# Patient Record
Sex: Female | Born: 1980 | Race: White | Hispanic: No | Smoking: Never smoker
Health system: Southern US, Community
[De-identification: ages and names within clinical notes are randomized; demographics above are authoritative.]

## PROBLEM LIST (undated history)

## (undated) DIAGNOSIS — F419 Anxiety disorder, unspecified: Secondary | ICD-10-CM

## (undated) DIAGNOSIS — O24419 Gestational diabetes mellitus in pregnancy, unspecified control: Secondary | ICD-10-CM

## (undated) DIAGNOSIS — F32A Depression, unspecified: Secondary | ICD-10-CM

## (undated) DIAGNOSIS — F319 Bipolar disorder, unspecified: Secondary | ICD-10-CM

## (undated) DIAGNOSIS — D649 Anemia, unspecified: Secondary | ICD-10-CM

## (undated) DIAGNOSIS — F329 Major depressive disorder, single episode, unspecified: Secondary | ICD-10-CM

## (undated) HISTORY — PX: WISDOM TOOTH EXTRACTION: SHX21

---

## 2011-11-04 ENCOUNTER — Encounter (HOSPITAL_COMMUNITY): Payer: Self-pay | Admitting: Emergency Medicine

## 2011-11-04 ENCOUNTER — Inpatient Hospital Stay (HOSPITAL_COMMUNITY)
Admission: AD | Admit: 2011-11-04 | Discharge: 2011-11-06 | DRG: 449 | Disposition: A | Discharge status: Other Acute Inpatient Hospital | Payer: BC Managed Care – PPO | Attending: Internal Medicine | Admitting: Internal Medicine

## 2011-11-04 DIAGNOSIS — D649 Anemia, unspecified: Secondary | ICD-10-CM | POA: Diagnosis present

## 2011-11-04 DIAGNOSIS — I498 Other specified cardiac arrhythmias: Secondary | ICD-10-CM | POA: Diagnosis present

## 2011-11-04 DIAGNOSIS — F3289 Other specified depressive episodes: Secondary | ICD-10-CM | POA: Diagnosis present

## 2011-11-04 DIAGNOSIS — R45851 Suicidal ideations: Secondary | ICD-10-CM

## 2011-11-04 DIAGNOSIS — T424X4A Poisoning by benzodiazepines, undetermined, initial encounter: Principal | ICD-10-CM | POA: Diagnosis present

## 2011-11-04 DIAGNOSIS — T424X1A Poisoning by benzodiazepines, accidental (unintentional), initial encounter: Secondary | ICD-10-CM | POA: Diagnosis present

## 2011-11-04 DIAGNOSIS — T43204A Poisoning by unspecified antidepressants, undetermined, initial encounter: Secondary | ICD-10-CM | POA: Diagnosis present

## 2011-11-04 DIAGNOSIS — T43502A Poisoning by unspecified antipsychotics and neuroleptics, intentional self-harm, initial encounter: Secondary | ICD-10-CM | POA: Diagnosis present

## 2011-11-04 DIAGNOSIS — T50901A Poisoning by unspecified drugs, medicaments and biological substances, accidental (unintentional), initial encounter: Secondary | ICD-10-CM

## 2011-11-04 DIAGNOSIS — D6489 Other specified anemias: Secondary | ICD-10-CM | POA: Diagnosis present

## 2011-11-04 DIAGNOSIS — F329 Major depressive disorder, single episode, unspecified: Secondary | ICD-10-CM | POA: Diagnosis present

## 2011-11-04 DIAGNOSIS — E876 Hypokalemia: Secondary | ICD-10-CM | POA: Diagnosis present

## 2011-11-04 DIAGNOSIS — T43201A Poisoning by unspecified antidepressants, accidental (unintentional), initial encounter: Secondary | ICD-10-CM | POA: Diagnosis present

## 2011-11-04 DIAGNOSIS — T438X2A Poisoning by other psychotropic drugs, intentional self-harm, initial encounter: Secondary | ICD-10-CM | POA: Diagnosis present

## 2011-11-04 DIAGNOSIS — N390 Urinary tract infection, site not specified: Secondary | ICD-10-CM | POA: Diagnosis present

## 2011-11-04 HISTORY — DX: Depression, unspecified: F32.A

## 2011-11-04 HISTORY — DX: Major depressive disorder, single episode, unspecified: F32.9

## 2011-11-04 HISTORY — DX: Anxiety disorder, unspecified: F41.9

## 2011-11-04 LAB — URINALYSIS, ROUTINE W REFLEX MICROSCOPIC
Glucose, UA: NEGATIVE mg/dL
Nitrite: POSITIVE — AB
pH: 5 (ref 5.0–8.0)

## 2011-11-04 LAB — PREGNANCY, URINE: Preg Test, Ur: NEGATIVE

## 2011-11-04 LAB — CBC WITH DIFFERENTIAL/PLATELET
Basophils Absolute: 0 10*3/uL (ref 0.0–0.1)
Basophils Relative: 0 % (ref 0–1)
Eosinophils Absolute: 0 10*3/uL (ref 0.0–0.7)
Eosinophils Relative: 1 % (ref 0–5)
HCT: 34.5 % — ABNORMAL LOW (ref 36.0–46.0)
Hemoglobin: 11.8 g/dL — ABNORMAL LOW (ref 12.0–15.0)
MCH: 30.8 pg (ref 26.0–34.0)
MCHC: 34.2 g/dL (ref 30.0–36.0)
Monocytes Absolute: 0.4 10*3/uL (ref 0.1–1.0)
Monocytes Relative: 6 % (ref 3–12)
Neutro Abs: 3.3 10*3/uL (ref 1.7–7.7)
RDW: 12.7 % (ref 11.5–15.5)

## 2011-11-04 LAB — RAPID URINE DRUG SCREEN, HOSP PERFORMED
Barbiturates: NOT DETECTED
Cocaine: NOT DETECTED

## 2011-11-04 LAB — URINE MICROSCOPIC-ADD ON

## 2011-11-04 LAB — COMPREHENSIVE METABOLIC PANEL
AST: 14 U/L (ref 0–37)
Albumin: 3.9 g/dL (ref 3.5–5.2)
BUN: 12 mg/dL (ref 6–23)
Calcium: 9 mg/dL (ref 8.4–10.5)
Chloride: 103 mEq/L (ref 96–112)
Creatinine, Ser: 0.67 mg/dL (ref 0.50–1.10)
Total Bilirubin: 1.1 mg/dL (ref 0.3–1.2)
Total Protein: 6.7 g/dL (ref 6.0–8.3)

## 2011-11-04 LAB — ACETAMINOPHEN LEVEL: Acetaminophen (Tylenol), Serum: <15 ug/mL (ref 10–30)

## 2011-11-04 LAB — TROPONIN I: Troponin I: <0.3 ng/mL (ref <0.30)

## 2011-11-04 LAB — MAGNESIUM: Magnesium: 1.8 mg/dL (ref 1.5–2.5)

## 2011-11-04 MED ORDER — DEXTROSE 5 % IV SOLN: 1.0000 g | INTRAVENOUS | Status: DC

## 2011-11-04 MED ORDER — SODIUM CHLORIDE 0.9 % IV SOLN: INTRAVENOUS | Status: DC

## 2011-11-04 MED ORDER — ONDANSETRON HCL 4 MG/2ML IJ SOLN
4.0000 mg | Freq: Once | INTRAMUSCULAR | Status: AC
  Administered 2011-11-04: 4 mg via INTRAVENOUS
  Filled 2011-11-04: qty 2

## 2011-11-04 MED ORDER — SODIUM CHLORIDE 0.9 % IV BOLUS (SEPSIS)
1000.0000 mL | Freq: Once | INTRAVENOUS | Status: AC
  Administered 2011-11-04: 1000 mL via INTRAVENOUS

## 2011-11-04 MED ORDER — ONDANSETRON HCL 4 MG/2ML IJ SOLN: 4.0000 mg | Freq: Three times a day (TID) | INTRAMUSCULAR | Status: DC | PRN

## 2011-11-04 MED ORDER — DEXTROSE 5 % IV SOLN
1.0000 g | Freq: Once | INTRAVENOUS | Status: AC
  Administered 2011-11-04: 1 g via INTRAVENOUS
  Filled 2011-11-04: qty 10

## 2011-11-04 MED ORDER — DEXTROSE 5 % IV SOLN
1.0000 g | INTRAVENOUS | Status: DC
  Administered 2011-11-05 – 2011-11-06 (×2): 1 g via INTRAVENOUS
  Filled 2011-11-04 (×2): qty 10

## 2011-11-04 MED ORDER — CHARCOAL ACTIVATED PO LIQD
50.0000 g | Freq: Once | ORAL | Status: AC
  Administered 2011-11-04: 50 g via ORAL
  Filled 2011-11-04: qty 240

## 2011-11-04 NOTE — ED Provider Notes (Signed)
History     CSN: [id number]  Arrival date & time 11/04/11  2111   First MD Initiated Contact with Patient 11/04/11 2118      Chief Complaint  Patient presents with  . Drug Overdose    (Consider location/radiation/quality/duration/timing/severity/associated sxs/prior treatment) HPI Comments: Patient arrives via EMS after intentional ingestion of trazodone, lorazepam Celexa about 90 minutes ago. She was trying to hurt herself. She has a history of depression and multiple stressors in her life. She denies ethanol ingestion or illicit drug use. She states she estimates is about 60 50 mg trazodone's, 5 lorazepam and 20 Celexa.  She vomited several times on arrival of to the ED with no fragments. Denies abdominal pain, chest pain or shortness of breath.  The history is provided by the patient, the EMS personnel and a relative.    Past Medical History  Diagnosis Date  . Depression     Past Surgical History  Procedure Date  . Cesarean section   . Extraction of wisdom teeth     Family History  Problem Relation Age of Onset  . Cancer Mother     History  Substance Use Topics  . Smoking status: Never Smoker   . Smokeless tobacco: Not on file  . Alcohol Use: Yes     occ    OB History    Grav Para Term Preterm Abortions TAB SAB Ect Mult Living                  Review of Systems  Constitutional: Negative for activity change and appetite change.  HENT: Negative for congestion and rhinorrhea.   Respiratory: Negative for cough, chest tightness and shortness of breath.   Cardiovascular: Negative for chest pain.  Gastrointestinal: Positive for nausea and vomiting. Negative for abdominal pain.  Genitourinary: Negative for dysuria and hematuria.  Musculoskeletal: Negative for back pain.  Skin: Negative for rash.  Neurological: Negative for headaches.  Psychiatric/Behavioral: Positive for suicidal ideas and self-injury.    Allergies  Penicillins  Home Medications   Current  Outpatient Rx  Name Route Sig Dispense Refill  . CITALOPRAM HYDROBROMIDE 10 MG PO TABS Oral Take 20 mg by mouth daily.    Marland Kitchen LORAZEPAM 0.5 MG PO TABS Oral Take 0.25-0.5 mg by mouth 2 (two) times daily.    . TRAZODONE HCL 50 MG PO TABS Oral Take 100 mg by mouth at bedtime.      BP 111/66  Pulse 88  Temp 99.1 F (37.3 C) (Oral)  Resp 15  SpO2 99%  LMP 10/20/2011  Physical Exam  Constitutional: She is oriented to person, place, and time. She appears well-developed and well-nourished. No distress.       Somnolent but arousable, protecting airway  HENT:  Head: Normocephalic and atraumatic.  Mouth/Throat: Oropharynx is clear and moist. No oropharyngeal exudate.  Eyes: Conjunctivae and EOM are normal. Pupils are equal, round, and reactive to light.  Neck: Normal range of motion. Neck supple.  Cardiovascular: Normal rate, regular rhythm and normal heart sounds.   No murmur heard. Pulmonary/Chest: Effort normal and breath sounds normal. No respiratory distress.  Abdominal: Soft. There is no tenderness. There is no rebound and no guarding.  Musculoskeletal: Normal range of motion. She exhibits no edema and no tenderness.  Neurological: She is alert and oriented to person, place, and time. No cranial nerve deficit.       No hyperreflexia or clonus  Skin: Skin is warm.    ED Course  Procedures (including  critical care time)  Labs Reviewed  CBC WITH DIFFERENTIAL - Abnormal; Notable for the following:    RBC 3.83 (*)     Hemoglobin 11.8 (*)     HCT 34.5 (*)     All other components within normal limits  COMPREHENSIVE METABOLIC PANEL - Abnormal; Notable for the following:    Potassium 3.4 (*)     Glucose, Bld 130 (*)     All other components within normal limits  URINALYSIS, ROUTINE W REFLEX MICROSCOPIC - Abnormal; Notable for the following:    APPearance CLOUDY (*)     Specific Gravity, Urine 1.033 (*)     Bilirubin Urine SMALL (*)     Ketones, ur TRACE (*)     Protein, ur 30 (*)      Nitrite POSITIVE (*)     Leukocytes, UA MODERATE (*)     All other components within normal limits  URINE RAPID DRUG SCREEN (HOSP PERFORMED) - Abnormal; Notable for the following:    Amphetamines POSITIVE (*)     All other components within normal limits  SALICYLATE LEVEL - Abnormal; Notable for the following:    Salicylate Lvl <2.0 (*)     All other components within normal limits  URINE MICROSCOPIC-ADD ON - Abnormal; Notable for the following:    Squamous Epithelial / LPF MANY (*)     Bacteria, UA MANY (*)     Crystals CA OXALATE CRYSTALS (*)     All other components within normal limits  TROPONIN I  PREGNANCY, URINE  ACETAMINOPHEN LEVEL  MAGNESIUM   No results found.   1. Overdose   2. Urinary tract infection   3. Suicidal ideation       MDM  Intentional overdose on sedating medications.  Vital signs stable, protecting airway.  Tox unremarkable.  UTI noted. EKG with normal intervals. Given extensive ingestion, poison control recommends 24-hour observation for QTC prolongation. We'll check magnesium and potassium levels, tox screen, acetaminophen, salicylate ethanol  Admission d/w Dr. Lovell Sheehan for monitoring of QTc.  Date: 11/04/2011  Rate: 86  Rhythm: normal sinus rhythm  QRS Axis: left  Intervals: normal  ST/T Wave abnormalities: normal  Conduction Disutrbances:none  Narrative Interpretation:   Old EKG Reviewed: none available  CRITICAL CARE Performed by: Glynn Octave   Total critical care time: 30  Critical care time was exclusive of separately billable procedures and treating other patients.  Critical care was necessary to treat or prevent imminent or life-threatening deterioration.  Critical care was time spent personally by me on the following activities: development of treatment plan with patient and/or surrogate as well as nursing, discussions with consultants, evaluation of patient's response to treatment, examination of patient, obtaining  history from patient or surrogate, ordering and performing treatments and interventions, ordering and review of laboratory studies, ordering and review of radiographic studies, pulse oximetry and re-evaluation of patient's condition.    Glynn Octave, MD 11/04/11 [phone]
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
## 2011-11-04 NOTE — ED Notes (Signed)
GEX:BMWU<XL> Expected date:<BR> Expected time:<BR> Means of arrival:<BR> Comments:<BR> Res A, Medic 211, OD, A/O X 3, VSS

## 2011-11-04 NOTE — ED Notes (Signed)
Per EMS pt states she took trazadone 50mg  60 tabs, lorazepam 0.5mg  5 tabs, and celexa 10mg  60 tabs about an hour and a half ago   Pt is alert and oriented x 3 upon arrival  Pt has vomited x 2 prior to arrival  Pt's mother passed away a few weeks ago and pt is going through a separation over the past 6 mths, and she lost a child 10 yrs ago  Pt states she was trying to kill herself tonight

## 2011-11-05 ENCOUNTER — Encounter (HOSPITAL_COMMUNITY): Payer: Self-pay | Admitting: *Deleted

## 2011-11-05 DIAGNOSIS — D649 Anemia, unspecified: Secondary | ICD-10-CM | POA: Diagnosis present

## 2011-11-05 DIAGNOSIS — T43204A Poisoning by unspecified antidepressants, undetermined, initial encounter: Secondary | ICD-10-CM

## 2011-11-05 DIAGNOSIS — T43502A Poisoning by unspecified antipsychotics and neuroleptics, intentional self-harm, initial encounter: Secondary | ICD-10-CM

## 2011-11-05 DIAGNOSIS — T424X4A Poisoning by benzodiazepines, undetermined, initial encounter: Principal | ICD-10-CM

## 2011-11-05 DIAGNOSIS — F329 Major depressive disorder, single episode, unspecified: Secondary | ICD-10-CM

## 2011-11-05 LAB — CBC
Platelets: 184 10*3/uL (ref 150–400)
RBC: 3.46 MIL/uL — ABNORMAL LOW (ref 3.87–5.11)
RDW: 12.8 % (ref 11.5–15.5)
WBC: 6.8 10*3/uL (ref 4.0–10.5)

## 2011-11-05 LAB — MRSA PCR SCREENING: MRSA by PCR: NEGATIVE

## 2011-11-05 LAB — BASIC METABOLIC PANEL
Calcium: 8.8 mg/dL (ref 8.4–10.5)
Creatinine, Ser: 0.65 mg/dL (ref 0.50–1.10)
GFR calc Af Amer: >90 mL/min (ref >90)
Sodium: 140 mEq/L (ref 135–145)

## 2011-11-05 MED ORDER — ONDANSETRON HCL 4 MG PO TABS: 4.0000 mg | ORAL_TABLET | Freq: Four times a day (QID) | ORAL | Status: DC | PRN

## 2011-11-05 MED ORDER — POTASSIUM CHLORIDE CRYS ER 20 MEQ PO TBCR
20.0000 meq | EXTENDED_RELEASE_TABLET | Freq: Once | ORAL | Status: AC
  Administered 2011-11-05: 20 meq via ORAL
  Filled 2011-11-05: qty 1

## 2011-11-05 MED ORDER — ACETAMINOPHEN 650 MG RE SUPP: 650.0000 mg | Freq: Four times a day (QID) | RECTAL | Status: DC | PRN

## 2011-11-05 MED ORDER — ENOXAPARIN SODIUM 40 MG/0.4ML ~~LOC~~ SOLN
40.0000 mg | SUBCUTANEOUS | Status: DC
  Administered 2011-11-05 – 2011-11-06 (×2): 40 mg via SUBCUTANEOUS
  Filled 2011-11-05 (×2): qty 0.4

## 2011-11-05 MED ORDER — SODIUM CHLORIDE 0.9 % IV SOLN
INTRAVENOUS | Status: DC
  Administered 2011-11-05: 1000 mL via INTRAVENOUS

## 2011-11-05 MED ORDER — ONDANSETRON HCL 4 MG/2ML IJ SOLN: 4.0000 mg | Freq: Four times a day (QID) | INTRAMUSCULAR | Status: DC | PRN

## 2011-11-05 MED ORDER — ACETAMINOPHEN 325 MG PO TABS: 650.0000 mg | ORAL_TABLET | Freq: Four times a day (QID) | ORAL | Status: DC | PRN

## 2011-11-05 MED ORDER — ALUM & MAG HYDROXIDE-SIMETH 200-200-20 MG/5ML PO SUSP
30.0000 mL | Freq: Four times a day (QID) | ORAL | Status: DC | PRN
Start: 2011-11-05 — End: 2011-11-06

## 2011-11-05 NOTE — Progress Notes (Signed)
CARE MANAGEMENT NOTE 11/05/2011  Patient:  Stacey Pittman, Stacey Pittman   Account Number:  [id number]  Date Initiated:  11/05/2011  Documentation initiated by:  DAVIS,RHONDA  Subjective/Objective Assessment:   patient with overdose of several different medications, obtunded.     Action/Plan:   lives at home may need placement   Anticipated DC Date:  11/08/2011   Anticipated DC Plan:  HOME/SELF CARE  In-house referral  Clinical Social Worker      DC Planning Services  NA      [facility] Choice  NA   Choice offered to / List presented to:  NA   DME arranged  NA      DME agency  NA     HH arranged  NA      HH agency  NA   Status of service:  In process, will continue to follow Medicare Important Message given?  NA - LOS <3 / Initial given by admissions (If response is "NO", the following Medicare IM given date fields will be blank) Date Medicare IM given:   Date Additional Medicare IM given:    Discharge Disposition:    Per UR Regulation:  Reviewed for med. necessity/level of care/duration of stay  If discussed at Long Length of Stay Meetings, dates discussed:    Comments:  [id number]/[id number] Earlene Plater, RN, BSN, CCM: CHART REVIEWED AND UPDATED. NO DISCHARGE NEEDS PRESENT AT THIS TIME. CASE MANAGEMENT [phone]

## 2011-11-05 NOTE — Progress Notes (Signed)
Transfer Note:  Report called to Tom,RN on 5E.  Pt will be going to Rm 1507, pt is aware.  Victoria,NT, who is currently pt's suicide sitter will be going with pt to 1507.  Pt will be transferred via w/c to new room.  All personal belongings will be given to nursing staff on 5E.

## 2011-11-05 NOTE — Plan of Care (Signed)
Problem: Phase II Progression Outcomes Goal: Progress activity as tolerated unless otherwise ordered Outcome: Progressing Pt walking to bathroom, no dizziness noted. Goal: Vital signs remain stable Outcome: Progressing Vital signs stable, slightly hypotensive though not symptomatic.  Continued close monitoring of QTC interval on monitor.  Pt to transfer to telemetry when bed available.

## 2011-11-05 NOTE — Progress Notes (Signed)
Patient arrived for ED after receiving report from [facility] patient was alert but drowsy. Patient able to slide from stretcher to bed. Patient is aware of situation . VSS. Family waiting to come to bedside.

## 2011-11-05 NOTE — Progress Notes (Signed)
[facility] PROGRESS NOTE  Stacey Pittman WUJ:[id number] DOB: 1980-04-21 DOA: 11/04/2011 PCP: No primary provider on file.  Assessment/Plan: Principal Problem:  *Overdose of benzodiazepine Active Problems:  Overdose of antidepressant  Suicidal intent  Depression  UTI (lower urinary tract infection)  Hypokalemia  Anemia  1. Drug Overdose (lorazepam, citalopram and trazodone): Mental status changes have significantly improved/resolved. Discussed with Stacey Pittman with the poison control center who recommended continue telemetry monitoring for at least 24 hours from ingestion (said to be approximately 8 PM on 9/8). Monitor for prolonged QTC. and seizures. If patient has seizures or agitation they recommend when necessary IV Ativan and avoid medications such as Haldol or Geodon. QTC by EKG this morning: 443 ms. 2. Suicide attempt: Patient denies any suicidal or homicidal ideations this morning. She does not see a psychiatrist as outpatient. Continue 1:1 sitter and suicide precautions. Psychiatry consulted. 3. Hypokalemia: Repleted. Magnesium was 1.8. 4. Anemia, possibly chronic: Follow CBC in a.m. 5. Urinary tract infection: Patient recently had colposcopy at [facility] for abnormal Pap smear. She denies history of dysuria or frequency but does indicate foul-smelling urine. Rocephin was started prior to urine cultures. Continue same and will send urine culture which may not be helpful. 6. Depression: Psychiatry consulted. 7. Sinus bradycardia, asymptomatic: Check TSH.  Code Status: Full Family Communication: None Disposition Plan: We'll transfer to telemetry, monitor additional 24 hours and possible discharge home on 9/10 pending psychiatric clearance.   Brief narrative: 31 y.o. female who was brought to the Ed emergently after taking 60 ( 0.5 mg) lorazepam tablets, and 60 (10 mg) Celexa tablets and 5 (50 mg) Tazodone tables. The ingestion occurred 10 to 15 minutes before arrival to  the ED via EMS, she told her boyfriend 10 minutes after her ingestion and told him she was tired of everything and it was too much. He called EMS and in the ED she was administered activtivated charcoal, and Poison control was called for recommendations. Patient also indicates that she vomited an undetermined amount of pills prior to coming to the emergency department. She denies prior suicide attempts.   Consultants:  Psychiatry-pending.  Poison Control Center.  Procedures:  None  Antibiotics:  Rocephin 9/8  HPI/Subjective: Feels tired. Denies suicidal or homicidal ideations. No dizziness or lightheadedness. Foul-smelling urine but no dysuria or urinary frequency. Denies pain.  Objective: Filed Vitals:   11/05/11 0740 11/05/11 0800 11/05/11 0900 11/05/11 1000  BP: 101/58 96/57 100/54 94/53  Pulse: 71 49 51 52  Temp:  98.3 F (36.8 C)    TempSrc:  Axillary    Resp: 14 12 14 11   Height:      Weight:      SpO2: 100% 98% 98% 98%    Intake/Output Summary (Last 24 hours) at 11/05/11 1201 Last data filed at 11/05/11 1100  Gross per 24 hour  Intake    810 ml  Output    800 ml  Net     10 ml   Filed Weights   11/05/11 0135  Weight: 92.2 kg (203 lb 4.2 oz)    Exam:   General exam: Moderately built and nourished female patient who is in no obvious distress.  Respiratory system: Clear to auscultation. No increased work of breathing.  Cardiovascular system: S1 and S2 heard, regular rate and rhythm. No JVD, murmurs, gallops or pedal edema. Telemetry shows sinus bradycardia in the 50s.  Gastrointestinal system: Abdomen is nondistended, soft and nontender. Normal bowel sounds heard.  Central nervous  system: Alert and oriented. No focal neurological deficits.  Extremities: Symmetric 5 x 5 power.  Psychiatry: Slightly flat affect but establishes eyecontact, normal tone of voice cooperative.   Data Reviewed: Basic Metabolic Panel:  Lab 11/05/11 9811 11/04/11 2135  NA  140 137  K 3.7 3.4*  CL 105 103  CO2 26 23  GLUCOSE 117* 130*  BUN 8 12  CREATININE 0.65 0.67  CALCIUM 8.8 9.0  MG -- 1.8  PHOS -- --   Liver Function Tests:  Lab 11/04/11 2135  AST 14  ALT 9  ALKPHOS 41  BILITOT 1.1  PROT 6.7  ALBUMIN 3.9   No results found for this basename: LIPASE:5,AMYLASE:5 in the last 168 hours No results found for this basename: AMMONIA:5 in the last 168 hours CBC:  Lab 11/05/11 0328 11/04/11 2135  WBC 6.8 5.7  NEUTROABS -- 3.3  HGB 10.6* 11.8*  HCT 31.3* 34.5*  MCV 90.5 90.1  PLT 184 177   Cardiac Enzymes:  Lab 11/04/11 2135  CKTOTAL --  CKMB --  CKMBINDEX --  TROPONINI <0.30   BNP (last 3 results) No results found for this basename: PROBNP:3 in the last 8760 hours CBG: No results found for this basename: GLUCAP:5 in the last 168 hours  Recent Results (from the past 240 hour(s))  MRSA PCR SCREENING     Status: Normal   Collection Time   11/05/11  2:25 AM      Component Value Range Status Comment   MRSA by PCR NEGATIVE  NEGATIVE Final      Studies: No results found.  Scheduled Meds:    . cefTRIAXone (ROCEPHIN)  IV  1 g Intravenous Once  . cefTRIAXone (ROCEPHIN)  IV  1 g Intravenous Q24H  . charcoal activated (NO SORBITOL)  50 g Oral Once  . enoxaparin (LOVENOX) injection  40 mg Subcutaneous Q24H  . ondansetron (ZOFRAN) IV  4 mg Intravenous Once  . potassium chloride  20 mEq Oral Once  . sodium chloride  1,000 mL Intravenous Once  . DISCONTD: sodium chloride   Intravenous STAT  . DISCONTD: cefTRIAXone (ROCEPHIN)  IV  1 g Intravenous Q24H   Continuous Infusions:    . sodium chloride 1,000 mL (11/05/11 0606)        [facility]  [facility] Pager [phone]. If 8PM-8AM, please contact night-coverage at [email or web address], password [facility] 11/05/2011, 12:01 PM  LOS: 1 day
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
## 2011-11-06 ENCOUNTER — Encounter (HOSPITAL_COMMUNITY): Payer: Self-pay | Admitting: Emergency Medicine

## 2011-11-06 ENCOUNTER — Telehealth (HOSPITAL_COMMUNITY): Payer: Self-pay | Admitting: *Deleted

## 2011-11-06 ENCOUNTER — Inpatient Hospital Stay (HOSPITAL_COMMUNITY)
Admission: AD | Admit: 2011-11-06 | Discharge: 2011-11-09 | DRG: 426 | Disposition: A | Discharge status: Home / Self Care | Payer: BC Managed Care – PPO | Source: Ambulatory Visit | Attending: Psychiatry | Admitting: Psychiatry

## 2011-11-06 DIAGNOSIS — T43201A Poisoning by unspecified antidepressants, accidental (unintentional), initial encounter: Secondary | ICD-10-CM

## 2011-11-06 DIAGNOSIS — F329 Major depressive disorder, single episode, unspecified: Secondary | ICD-10-CM

## 2011-11-06 DIAGNOSIS — T424X1A Poisoning by benzodiazepines, accidental (unintentional), initial encounter: Secondary | ICD-10-CM | POA: Diagnosis present

## 2011-11-06 DIAGNOSIS — D649 Anemia, unspecified: Secondary | ICD-10-CM

## 2011-11-06 DIAGNOSIS — F4321 Adjustment disorder with depressed mood: Principal | ICD-10-CM | POA: Diagnosis present

## 2011-11-06 DIAGNOSIS — E876 Hypokalemia: Secondary | ICD-10-CM

## 2011-11-06 DIAGNOSIS — T424X4A Poisoning by benzodiazepines, undetermined, initial encounter: Secondary | ICD-10-CM | POA: Diagnosis present

## 2011-11-06 DIAGNOSIS — R45851 Suicidal ideations: Secondary | ICD-10-CM

## 2011-11-06 DIAGNOSIS — Y92009 Unspecified place in unspecified non-institutional (private) residence as the place of occurrence of the external cause: Secondary | ICD-10-CM

## 2011-11-06 LAB — CBC
MCH: 30.9 pg (ref 26.0–34.0)
MCHC: 33.5 g/dL (ref 30.0–36.0)
MCV: 92.2 fL (ref 78.0–100.0)
Platelets: 170 10*3/uL (ref 150–400)
RDW: 13 % (ref 11.5–15.5)

## 2011-11-06 LAB — URINE CULTURE

## 2011-11-06 MED ORDER — HYDROXYZINE HCL 50 MG PO TABS
50.0000 mg | ORAL_TABLET | Freq: Every evening | ORAL | Status: DC | PRN
  Administered 2011-11-06: 50 mg via ORAL
  Filled 2011-11-06 (×7): qty 1

## 2011-11-06 MED ORDER — LEVOFLOXACIN 500 MG PO TABS
500.0000 mg | ORAL_TABLET | Freq: Every day | ORAL | Status: AC
  Administered 2011-11-07 – 2011-11-09 (×3): 500 mg via ORAL
  Filled 2011-11-06 (×3): qty 1

## 2011-11-06 MED ORDER — ACETAMINOPHEN 325 MG PO TABS: 650.0000 mg | ORAL_TABLET | Freq: Four times a day (QID) | ORAL | Status: DC | PRN

## 2011-11-06 MED ORDER — MAGNESIUM HYDROXIDE 400 MG/5ML PO SUSP: 30.0000 mL | Freq: Every day | ORAL | Status: DC | PRN

## 2011-11-06 MED ORDER — NICOTINE 21 MG/24HR TD PT24
21.0000 mg | MEDICATED_PATCH | Freq: Every day | TRANSDERMAL | Status: DC
  Filled 2011-11-06: qty 1

## 2011-11-06 MED ORDER — ALUM & MAG HYDROXIDE-SIMETH 200-200-20 MG/5ML PO SUSP: 30.0000 mL | ORAL | Status: DC | PRN

## 2011-11-06 NOTE — Discharge Summary (Signed)
Physician Discharge Summary  Stacey Pittman ZOX:[id number] DOB: 04-13-1980 DOA: 11/04/2011  PCP: No primary provider on file.  Admit date: 11/04/2011 Discharge date: 11/06/2011  Recommendations for Outpatient Follow-up:  1. Followup with primary medical doctor on discharge from the behavioral health Center.  Discharge Diagnoses:  Principal Problem:  *Overdose of benzodiazepine Active Problems:  Overdose of antidepressant  Suicidal intent  Depression  UTI (lower urinary tract infection)  Hypokalemia  Anemia   Discharge Condition: Improved and stable.  Diet recommendation: Regular diet.  Filed Weights   11/05/11 0135  Weight: 92.2 kg (203 lb 4.2 oz)    History of present illness:  31 y.o. female who was brought to the Ed emergently after taking 60 ( 0.5 mg) lorazepam tablets, and 60 (10 mg) Celexa tablets and 5 (50 mg) Tazodone tables. The ingestion occurred 10 to 15 minutes before arrival to the ED via EMS, she told her boyfriend 10 minutes after her ingestion and told him she was tired of everything and it was too much. He called EMS and in the ED she was administered activtivated charcoal, and Poison control was called for recommendations. Patient also indicates that she vomited an undetermined amount of pills prior to coming to the emergency department. She denies prior suicide attempts.   Hospital Course:  1. Drug Overdose (lorazepam, citalopram and trazodone): Patient was admitted to the step down unit for close monitoring. Culprit medications were discontinued. Mental status changes have resolved. Poison control center was consulted. 2. Suicide attempt: Patient denies any suicidal or homicidal ideations. Patient was placed on suicide precautions and 1:1 sitter. Psychiatry consulted and recommended inpatient psychiatric care. Medications pertaining to psychiatry to be resumed at behavioral health Center. She will be discharged to [facility] tonight.  3. Hypokalemia: Repleted. Magnesium was  1.8. 4. Anemia, possibly chronic: Stable 5. Urinary tract infection: Patient recently had colposcopy at [facility] for abnormal Pap smear. She denies history of dysuria or frequency but does indicate foul-smelling urine. Rocephin was started prior to urine cultures. She has completed 3 days of IV Rocephin and will be discontinued.  6. Depression: Patient will be transferred to behavioral health Center for further inpatient psychiatric care. 7. Sinus bradycardia, asymptomatic: TSH normal. Probably physiologic.  Procedures:  None  Consultations:  Psychiatry: Dr. Mickeal Skinner  [facility]  Discharge Exam:  Complaints Denies complaints.   Filed Vitals:   11/05/11 2120 11/06/11 0643 11/06/11 1354 11/06/11 1700  BP: 117/60 98/63 122/68 114/74  Pulse: 56 62 65 57  Temp: 98.4 F (36.9 C) 98.1 F (36.7 C) 98.8 F (37.1 C) 98.4 F (36.9 C)  TempSrc: Oral Oral Oral Oral  Resp: 16 15 17 16   Height:      Weight:      SpO2: 99% 98% 99% 98%    General exam: Moderately built and nourished female patient who is in no obvious distress.  Respiratory system: Clear to auscultation. No increased work of breathing.  Cardiovascular system: S1 and S2 heard, regular rate and rhythm. No JVD, murmurs, gallops or pedal edema.  Gastrointestinal system: Abdomen is nondistended, soft and nontender. Normal bowel sounds heard.  Central nervous system: Alert and oriented. No focal neurological deficits.  Extremities: Symmetric 5 x 5 power.  Psychiatry: More cheerful and pleasant today.  Discharge Instructions  Discharge Orders    Future Orders Please Complete By Expires   Diet general      Increase activity slowly        Medication List  As of 11/06/2011  7:02 PM   STOP taking these medications         citalopram 10 MG tablet      LORazepam 0.5 MG tablet      traZODone 50 MG tablet           Follow-up Information    Schedule an appointment as soon as possible for a  visit with Primary MD.          The results of significant diagnostics from this hospitalization (including imaging, microbiology, ancillary and laboratory) are listed below for reference.    Significant Diagnostic Studies: No results found.  Microbiology: Recent Results (from the past 240 hour(s))  URINE CULTURE     Status: Normal (Preliminary result)   Collection Time   11/04/11  9:00 PM      Component Value Range Status Comment   Specimen Description URINE, CLEAN CATCH   Final    Special Requests NONE   Final    Culture  Setup Time 11/05/2011 22:57   Final    Colony Count 90,000 COLONIES/ML   Final    Culture GRAM NEGATIVE RODS   Final    Report Status PENDING   Incomplete   MRSA PCR SCREENING     Status: Normal   Collection Time   11/05/11  2:25 AM      Component Value Range Status Comment   MRSA by PCR NEGATIVE  NEGATIVE Final      Labs: Basic Metabolic Panel:  Lab 11/05/11 2130 11/04/11 2135  NA 140 137  K 3.7 3.4*  CL 105 103  CO2 26 23  GLUCOSE 117* 130*  BUN 8 12  CREATININE 0.65 0.67  CALCIUM 8.8 9.0  MG -- 1.8  PHOS -- --   Liver Function Tests:  Lab 11/04/11 2135  AST 14  ALT 9  ALKPHOS 41  BILITOT 1.1  PROT 6.7  ALBUMIN 3.9   No results found for this basename: LIPASE:5,AMYLASE:5 in the last 168 hours No results found for this basename: AMMONIA:5 in the last 168 hours CBC:  Lab 11/06/11 0451 11/05/11 0328 11/04/11 2135  WBC 6.1 6.8 5.7  NEUTROABS -- -- 3.3  HGB 11.5* 10.6* 11.8*  HCT 34.3* 31.3* 34.5*  MCV 92.2 90.5 90.1  PLT 170 184 177   Cardiac Enzymes:  Lab 11/04/11 2135  CKTOTAL --  CKMB --  CKMBINDEX --  TROPONINI <0.30   BNP: BNP (last 3 results) No results found for this basename: PROBNP:3 in the last 8760 hours CBG: No results found for this basename: GLUCAP:5 in the last 168 hours  Other labs: 1. Troponin: Less than 0.3 2. Salicylate level less than 2. 3. Acetaminophen level less than 15. 4. Urine pregnancy test:  Negative. 5. TSH 2.35. 6. UA: 21-50 white blood cells, many squamous epithelial cells and many bacteria. 30 mg/dL protein 7. UDS: Positive for amphetamines otherwise negative. Patient denied using amphetamines.  Time coordinating discharge: Less than 30 minutes  Signed:  Aarna Pittman  [facility] 11/06/2011, 7:02 PM

## 2011-11-06 NOTE — Progress Notes (Signed)
[facility] PROGRESS NOTE  Ziggy Reveles ZOX:[id number] DOB: Dec 26, 1980 DOA: 11/04/2011 PCP: No primary provider on file.  Assessment/Plan: Principal Problem:  *Overdose of benzodiazepine Active Problems:  Overdose of antidepressant  Suicidal intent  Depression  UTI (lower urinary tract infection)  Hypokalemia  Anemia  1. Drug Overdose (lorazepam, citalopram and trazodone): Mental status changes have resolved. Discussed with [facility] who recommended no need for further monitoring and can proceed with next level of care. Patient indicates that she takes the lorazepam as needed, may be 1 tablet per week. 2. Suicide attempt: Patient denies any suicidal or homicidal ideations. Continue 1:1 sitter and suicide precautions. Psychiatry consultation appreciated and they recommend inpatient psychiatric care. Patient is agreeable. Psychiatry to clarify if the medication changes they suggested are to be started now or at [facility]. 3. Hypokalemia: Repleted. Magnesium was 1.8. 4. Anemia, possibly chronic: Stable 5. Urinary tract infection: Patient recently had colposcopy at [facility] for abnormal Pap smear. She denies history of dysuria or frequency but does indicate foul-smelling urine. Rocephin was started prior to urine cultures. Continue same and will send urine culture which may not be helpful. Urine culture shows 90,000 colonies of gram-negative rods-sensitivity pending. 6. Depression: Psychiatry consultation appreciated. Awaiting inpatient psychiatric care. 7. Sinus bradycardia, asymptomatic: TSH normal. Probably physiologic.  Code Status: Full Family Communication: Offered to speak with patient's family and update care but patient declined. Disposition Plan: Medically stable for discharge to [facility] for inpatient psychiatric care.  Brief narrative: 31 y.o. female who was brought to the Ed emergently after taking 60 ( 0.5 mg) lorazepam tablets, and 60 (10 mg) Celexa tablets and 5 (50  mg) Tazodone tables. The ingestion occurred 10 to 15 minutes before arrival to the ED via EMS, she told her boyfriend 10 minutes after her ingestion and told him she was tired of everything and it was too much. He called EMS and in the ED she was administered activtivated charcoal, and Poison control was called for recommendations. Patient also indicates that she vomited an undetermined amount of pills prior to coming to the emergency department. She denies prior suicide attempts.   Consultants:  Psychiatry-pending.  Poison Control Center.  Procedures:  None  Antibiotics:  Rocephin 9/8  HPI/Subjective: Feels better. Stronger. Denies suicidal or homicidal ideations. Mild headache earlier this morning which has resolved. Tolerating diet. No dizziness or lightheadedness.  Objective: Filed Vitals:   11/05/11 2006 11/05/11 2120 11/06/11 0643 11/06/11 1354  BP: 104/52 117/60 98/63 122/68  Pulse: 66 56 62 65  Temp:  98.4 F (36.9 C) 98.1 F (36.7 C) 98.8 F (37.1 C)  TempSrc:  Oral Oral Oral  Resp: 12 16 15 17   Height:      Weight:      SpO2: 98% 99% 98% 99%    Intake/Output Summary (Last 24 hours) at 11/06/11 1656 Last data filed at 11/06/11 8119  Gross per 24 hour  Intake     60 ml  Output    700 ml  Net   -640 ml   Filed Weights   11/05/11 0135  Weight: 92.2 kg (203 lb 4.2 oz)    Exam:   General exam: Moderately built and nourished female patient who is in no obvious distress.  Respiratory system: Clear to auscultation. No increased work of breathing.  Cardiovascular system: S1 and S2 heard, regular rate and rhythm. No JVD, murmurs, gallops or pedal edema. Telemetry shows sinus bradycardia in the 50s to sinus rhythm in the 70s.  Episodic sinus bradycardia in the 40s, probably when asleep.  Gastrointestinal system: Abdomen is nondistended, soft and nontender. Normal bowel sounds heard.  Central nervous system: Alert and oriented. No focal neurological  deficits.  Extremities: Symmetric 5 x 5 power.  Psychiatry: More cheerful and pleasant today.   Data Reviewed: Basic Metabolic Panel:  Lab 11/05/11 6213 11/04/11 2135  NA 140 137  K 3.7 3.4*  CL 105 103  CO2 26 23  GLUCOSE 117* 130*  BUN 8 12  CREATININE 0.65 0.67  CALCIUM 8.8 9.0  MG -- 1.8  PHOS -- --   Liver Function Tests:  Lab 11/04/11 2135  AST 14  ALT 9  ALKPHOS 41  BILITOT 1.1  PROT 6.7  ALBUMIN 3.9   No results found for this basename: LIPASE:5,AMYLASE:5 in the last 168 hours No results found for this basename: AMMONIA:5 in the last 168 hours CBC:  Lab 11/06/11 0451 11/05/11 0328 11/04/11 2135  WBC 6.1 6.8 5.7  NEUTROABS -- -- 3.3  HGB 11.5* 10.6* 11.8*  HCT 34.3* 31.3* 34.5*  MCV 92.2 90.5 90.1  PLT 170 184 177   Cardiac Enzymes:  Lab 11/04/11 2135  CKTOTAL --  CKMB --  CKMBINDEX --  TROPONINI <0.30   BNP (last 3 results) No results found for this basename: PROBNP:3 in the last 8760 hours CBG: No results found for this basename: GLUCAP:5 in the last 168 hours  Recent Results (from the past 240 hour(s))  URINE CULTURE     Status: Normal (Preliminary result)   Collection Time   11/04/11  9:00 PM      Component Value Range Status Comment   Specimen Description URINE, CLEAN CATCH   Final    Special Requests NONE   Final    Culture  Setup Time 11/05/2011 22:57   Final    Colony Count 90,000 COLONIES/ML   Final    Culture GRAM NEGATIVE RODS   Final    Report Status PENDING   Incomplete   MRSA PCR SCREENING     Status: Normal   Collection Time   11/05/11  2:25 AM      Component Value Range Status Comment   MRSA by PCR NEGATIVE  NEGATIVE Final      Studies: No results found.  Scheduled Meds:    . cefTRIAXone (ROCEPHIN)  IV  1 g Intravenous Q24H  . enoxaparin (LOVENOX) injection  40 mg Subcutaneous Q24H   Continuous Infusions:        [facility]  [facility] Pager [phone]. If 8PM-8AM, please contact night-coverage at  [email or web address], password [facility] 11/06/2011, 4:56 PM  LOS: 2 days

## 2011-11-06 NOTE — Tx Team (Signed)
Initial Interdisciplinary Treatment Plan  PATIENT STRENGTHS: (choose at least two) Ability for insight Average or above average intelligence Capable of independent living Communication skills Financial means General fund of knowledge Motivation for treatment/growth Physical Health Supportive family/friends  PATIENT STRESSORS: Loss of Mother, unborn child, marriage*   PROBLEM LIST: Problem List/Patient Goals Date to be addressed Date deferred Reason deferred Estimated date of resolution  Depression 11/06/11     Suicide Risk 11/06/11                                                DISCHARGE CRITERIA:  Improved stabilization in mood, thinking, and/or behavior Need for constant or close observation no longer present Reduction of life-threatening or endangering symptoms to within safe limits Verbal commitment to aftercare and medication compliance  PRELIMINARY DISCHARGE PLAN: Outpatient therapy Return to previous living arrangement  PATIENT/FAMIILY INVOLVEMENT: This treatment plan has been presented to and reviewed with the patient, Stacey Pittman, and/or family member, .  The patient and family have been given the opportunity to ask questions and make suggestions.  Stacey Pittman 11/06/2011, 9:19 PM
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
## 2011-11-06 NOTE — Progress Notes (Signed)
Patient ID: Stacey Pittman, female   DOB: 04-23-1980, 31 y.o.   MRN: [id number] Pt admitted to [facility] voluntarily for SI with OD. Pt states she took 5 Ativan, 10 Celexa and Trazodone. Pt's UDS was positive for amphetamines. Pt states she has been under a lot of stress lately, stating that her mom passed away 4 months ago, it is nearing the one year anniversary of her divorce and it is nearing the 14 year anniversary of the stillbirth of her child. Pt states "It was all too much to handle all at once".  Pt with dull flat affect, endorsing depression. Pt states "It was a stupid thing to do and it scared me". Pt verbally contracts for safety. Pt oriented to unit rules and regulations. Pt to be monitored Q 15 minutes for safety.

## 2011-11-06 NOTE — Progress Notes (Signed)
Per psych MD, Pt needing inpt tx at [facility].  Notified Tori at [facility].  Tori to review Pt with an MD and call CSW back with a disposition.  CSW to continue to follow.  Amanda Madysen Faircloth, LCSWA Clinical Social Work [phone]  

## 2011-11-06 NOTE — Progress Notes (Signed)
Progress Note following consultation:  Pt is awake and alert.  She says she slept better.  Discussion focuses on the seriousness of her overdose and the reasons for it. She says it was frightening and at this point she no longer wants to think about suicide. The options for her at the point of discharge are discussed emphasizing the importance of having a realistic perception of her role and the importance of the relationship she has with her children's father. She indicates that her mother is capable and hopefully willing to watch the children she has an extended time in psychiatric facility to really learn about her depression, but the importance of stress and how to relieve it and ways to communicate most effectively with the important people in her life. It is stressed that her well-being reflects directly under well-being of her children. She is reviewing those things that precipitated her distress, in particular her child who was stillborn with porencephaly 10 years ago. This reflects a residual grieving process that is exacerbated every anniversary of the child's death. It will be extremely important for her to have therapist and a psychiatrist and identify somewhat effective in talking to both her and her boyfriend.  She is oriented to person place situation and need to have a better outlook on her life. She is participating and actively listening in this exchange. She is asked to seriously consider spending some time in the [facility] psychiatric facility if a bed becomes available. She indicates she will Assessment and Plan; This patient is in need of personal, bereavement in couple counseling. RECOMMENDATION:  1.  This patient has capacity to make decisions about her medical treatment. 2.  Suggest transfer to [facility], if accepted, for further psychiatric treatment for serious overdose and depression/bereavement. 3.  No further psychiatric needs identified. M.D. Psychiatrist signs off Ramonica Grigg J. Ferol Luz,  MD Psychiatrist  11/06/2011 2:27 PM

## 2011-11-06 NOTE — Progress Notes (Signed)
Met with Pt to discuss d/c plan.  Pt states that she and psych MD discussed [facility] as a d/c option and that she amenable to this plan.  Pt states that her boyfriend, as well as the father of her child, are aware of the plan and are supportive.  CSW to f/u with [facility] to determine bed status and Pt's disposition.  CSW thanked Pt for her time.  Spoke with Bunnie Pion at [facility].    Per Delorise Jackson, it's unlikely that Pt will be admitted today, as she hasn't been run by the MD yet and that they have several Pt's ahead of Pt in the ED.  Delorise Jackson suggested that CSW call in the morning for bed availability.  Notified MD.  CSW to continue to follow.  Providence Crosby, LCSWA Clinical Social Work [phone]

## 2011-11-06 NOTE — Progress Notes (Signed)
Report called to Kim at [facility] 

## 2011-11-07 DIAGNOSIS — F332 Major depressive disorder, recurrent severe without psychotic features: Secondary | ICD-10-CM

## 2011-11-07 LAB — TSH: TSH: 3.272 u[IU]/mL (ref 0.350–4.500)

## 2011-11-07 MED ORDER — DIPHENHYDRAMINE HCL 25 MG PO CAPS
25.0000 mg | ORAL_CAPSULE | Freq: Every evening | ORAL | Status: DC | PRN
  Administered 2011-11-07: 25 mg via ORAL
  Filled 2011-11-07 (×6): qty 1

## 2011-11-07 MED ORDER — CITALOPRAM HYDROBROMIDE 20 MG PO TABS
30.0000 mg | ORAL_TABLET | Freq: Every day | ORAL | Status: DC
  Administered 2011-11-07 – 2011-11-09 (×3): 30 mg via ORAL
  Filled 2011-11-07: qty 1
  Filled 2011-11-07: qty 4
  Filled 2011-11-07: qty 1
  Filled 2011-11-07: qty 4
  Filled 2011-11-07: qty 2
  Filled 2011-11-07 (×2): qty 1
  Filled 2011-11-07: qty 2

## 2011-11-07 MED ORDER — CHLORPROMAZINE HCL 10 MG PO TABS
10.0000 mg | ORAL_TABLET | Freq: Three times a day (TID) | ORAL | Status: DC | PRN
  Filled 2011-11-07: qty 1
  Filled 2011-11-07: qty 6
  Filled 2011-11-07: qty 2

## 2011-11-07 MED ORDER — FERROUS SULFATE 325 (65 FE) MG PO TABS
325.0000 mg | ORAL_TABLET | Freq: Two times a day (BID) | ORAL | Status: DC
  Administered 2011-11-08 – 2011-11-09 (×4): 325 mg via ORAL
  Filled 2011-11-07: qty 4
  Filled 2011-11-07 (×3): qty 1
  Filled 2011-11-07: qty 4
  Filled 2011-11-07 (×2): qty 1
  Filled 2011-11-07 (×2): qty 4

## 2011-11-07 NOTE — H&P (Signed)
Psychiatric Admission Assessment Adult  Patient Identification:  Stacey Pittman Date of Evaluation:  11/07/2011 Chief Complaint: "In a split second I did a stupid thing and took a bunch of pills and immediately resented it". History of Present Illness:: Patient was admitted to [facility] ER after she contacted her boyfriend, notifying him of her having impulsively taken 30 Celexa, 50 trazodone, and 5 lorazepam tablets.  The boyfriend called 911, who picked the patient up transporting her to [facility] ER.   Pt states that she has had a 4 month history of increasing anxiety and depression since the death of her mother 2013/05/01which she has cared for 2 years as she battled Creuzfeldt-Jakob disease.  Patient admonishes the death of her mother, plus upcoming October anniversaries of both an 1 year separation from husband of 16 years and the 23-Jul-2022 anniversary of the death of her full term infant son inutero from porencephaly.  Patient feels the compilation of these stressors along with financial stress, estate problems, car problems, loss of relationship with in-laws (whom she had been close), and her families thought of her needing to "put on a brave face.Marland Kitchenand be over my multiple losses" lead to her feeling overwhelmed, hopeless, fatigued, crying spells, hypersomnia without feeling rested.  Patient also indicated that she has been experiencing some mood liability in conjunction with manic symptoms such as hypersexualized behavior,  And "spurts" of increased energy mixed with emotional "highs" and "lows".  Patient states that although she attempted suicide by impulsively taking pills, she no longer feels suicidal and is remorseful with her impulsivity.  She states that her anxiety is currently 3/10, Depression 4/10, Hopelessness 2/10. She denies homicidality.  Mood Symptoms:  Concentration, Depression, Energy, Hopelessness, Hypomania/Mania, Mood Swings, Sadness, SI, Worthlessness, Depression Symptoms:  depressed  mood, hypersomnia, fatigue, difficulty concentrating, hopelessness, suicidal thoughts without plan, suicidal attempt, anxiety, (Hypo) Manic Symptoms:  Elevated Mood, Impulsivity, Sexually Inapproprite Behavior, Anxiety Symptoms:  Excessive Worry, Psychotic Symptoms:  none PTSD Symptoms: Denies. Pt had traumatic loss of full term infant at delivery.   Past Psychiatric History: Patient suffered MDD 10 years ago with death of full term infant at delivery, requiring 6 month rx of an unknown antidepressant.  Currently her PCM prescribes her psych meds. PCM started pt on current antidepressant 1 yr ago per pts request. Diagnosis: Anxiety, insomnia, depression  Hospitalizations: this is patient's first psych hospitalization  Outpatient Care: PCM prescribed current psychotropics  Substance Abuse Care: patient denies  Self-Mutilation:patient denies  Suicidal Attempts:patient denies  Violent Behaviors:patient denies   Past Medical History:  Pt has history of Parvovirus, and history of CMV (Cytomegally virus); Surgical hx significant for C-section and wisdom teeth extraction Past Medical History  Diagnosis Date  . Anxiety   . Depression    None. Loss of Consciousness:  denies Seizure History:  denies Cardiac History:  denies Traumatic Brain Injury:  denies Allergies:   Allergies  Allergen Reactions  . Penicillins    PTA Medications: No prescriptions prior to admission    Previous Psychotropic Medications:  Medication/Dose  Unrecalled anitidepressant post partum (grief)  Currently prescribed celexa, trazodone, and Prn ativan.             Substance Abuse History in the last 12 months: Pt denies elicit drug or nicotine use. Pt states she drinks seldomly, but sociably. Substance Age of 1st Use Last Use Amount Specific Type  Nicotine      Alcohol      Cannabis 19 11/02/11 1 joint "infreqently"  Opiates      Cocaine      Methamphetamines   Positive in UD But patient  denies use.    LSD      Ecstasy      Benzodiazepines  Patient prescribed PRN lorazepan, but negative in UD.    Caffeine      Inhalants      Others:                         Consequences of Substance Abuse: Medical Consequences:  addiction; organ damage; Legal Consequences:  incarceration; legal sequelae Family Consequences:  estrangement, poor relationship  Social History: Current Place of Residence:  [place], [place] Place of Birth:  [place], [place] Family Members: Lives with boyfriend, has half custody of daughter. Some family interaction, but stressed Marital Status:  Separated Children: 1 living  Sons: deceased at delivery (full term) in 2003  Daughters: 1 age 37 Relationships: boyfriend. 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 Military History:  None. Legal History: none Hobbies/Interests: playing with daughter  Family History:   Family History  Problem Relation Age of Onset  . Breast Cancer/Creuzfeldt Harlene Salts disease Mother      ?  /61 yrs   Family Psych HX: Maternal- mother had psychosis and hallucinations with C-J disorder; Brother- bipolar disorder; Mat GF-alcoholism Mental Status Examination/Evaluation: Objective:  Appearance: Neat and Well Groomed  Patent attorney::  Good  Speech:  Clear and Coherent and Rapid  Volume:  Normal  Mood:  Anxious and Depressed  Affect:  Congruent, Depressed and Tearful  Thought Process:  Some Circumstantiality primarily Goal Directed, Linear and Logical  Orientation:  Full  Thought Content:  WNL  Suicidal Thoughts:  No  Homicidal Thoughts:  No  Memory:  Immediate;   Good Recent;   Good Remote;   Good  Judgement:  Fair  Insight:  Fair   Psychomotor Activity:  Normal  Concentration:  Good  Recall:  Good  Akathisia:  No  Handed:  Right  AIMS (if indicated):     Assets:  Communication Skills Desire for Improvement Housing Physical Health Resilience Vocational/Educational  Sleep:  Number of Hours: 6.5     Laboratory/X-Ray Psychological Evaluation(s)  UDS-+ for amphetamines; UA- 9/8 UA mod leukocytes, WBC, and nitrite Chem- WNL except glucose elevated at 117.    Assessment:    AXIS I:  Major Depression, Recurrent severe AXIS II:  Deferred AXIS III:   Past Medical History  Diagnosis Date  . Anxiety   . Depression    AXIS IV:  problems related to social environment and problems with primary support group AXIS V:  41-50 serious symptoms  Treatment Plan/Recommendations:  Treatment Plan Summary: Daily contact with patient to assess and evaluate symptoms and progress in treatment Medication management Current Medications:  Current Facility-Administered Medications  Medication Dose Route Frequency Provider Last Rate Last Dose  . acetaminophen (TYLENOL) tablet 650 mg  650 mg Oral Q6H PRN Kerry Hough, PA      . alum & mag hydroxide-simeth (MAALOX/MYLANTA) 200-200-20 MG/5ML suspension 30 mL  30 mL Oral Q4H PRN Kerry Hough, PA      . hydrOXYzine (ATARAX/VISTARIL) tablet 50 mg  50 mg Oral QHS,MR X 1 Kerry Hough, PA   50 mg at 11/06/11 2216  .  levofloxacin (LEVAQUIN) tablet 500 mg  500 mg Oral Daily Kerry Hough, PA   500 mg at 11/07/11 0933  . magnesium hydroxide (MILK OF MAGNESIA) suspension 30 mL  30 mL Oral Daily PRN Kerry Hough, PA      . DISCONTD: nicotine (NICODERM CQ - dosed in mg/24 hours) patch 21 mg  21 mg Transdermal Q0600 Kerry Hough, PA       Facility-Administered Medications Ordered in Other Encounters  Medication Dose Route Frequency Provider Last Rate Last Dose  . DISCONTD: acetaminophen (TYLENOL) suppository 650 mg  650 mg Rectal Q6H PRN Ron Parker, MD      .  DISCONTD: acetaminophen (TYLENOL) tablet 650 mg  650 mg Oral Q6H PRN Ron Parker, MD      . DISCONTD: alum & mag hydroxide-simeth (MAALOX/MYLANTA) 200-200-20 MG/5ML suspension 30 mL  30 mL Oral Q6H PRN Ron Parker, MD      . DISCONTD: cefTRIAXone (ROCEPHIN) 1 g in dextrose 5 % 50 mL IVPB  1 g Intravenous Q24H Ron Parker, MD   1 g at 11/06/11 1848  . DISCONTD: enoxaparin (LOVENOX) injection 40 mg  40 mg Subcutaneous Q24H Ron Parker, MD   40 mg at 11/06/11 0911  . DISCONTD: ondansetron (ZOFRAN) injection 4 mg  4 mg Intravenous Q6H PRN Ron Parker, MD      . DISCONTD: ondansetron (ZOFRAN) tablet 4 mg  4 mg Oral Q6H PRN Ron Parker, MD        Observation Level/Precautions:  Q 15 min checks  Laboratory:  UA to eval UTI  Psychotherapy:  group  Medications:  See MAR  Routine PRN Medications:  Yes  Consultations:  None at this time  Discharge Concerns:  Outpatient therapy and psychotherapy needs to be addressed  Other:     Karoline Caldwell, Stacey Pittman-FNP-BC  9/11/20139:46 AM

## 2011-11-07 NOTE — Progress Notes (Signed)
11/07/2011         Time: 1500      Group Topic/Focus: The focus of the group is on enhancing the patients' ability to utilize positive relaxation strategies by practicing several that can be used at discharge.  Participation Level: Active  Participation Quality: Appropriate and Attentive  Affect: Appropriate  Cognitive: Oriented   Additional Comments: Patient able to identify healthy relaxation strategies she can use at discharge, including spending time with her daughter and listening to music.   Ahliya Glatt 11/07/2011 3:57 PM

## 2011-11-07 NOTE — H&P (Signed)
Medical/psychiatric screening examination/treatment/procedure(s) were performed by non-physician practitioner and as supervising physician I was immediately available for consultation/collaboration.  I have seen and examined this patient and agree with the major elements of this evaluation.  

## 2011-11-07 NOTE — [facility] Counselor (Signed)
Adult Comprehensive Assessment  Patient ID: Stacey Pittman, female   DOB: 16-Dec-1980, 31 y.o.   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 Family Relationships: no stressors Surveyor, quantity / Lack of resources (include bankruptcy): no stressors Housing / Lack of housing: no stressors Physical health (include injuries & life threatening diseases): no stressors  Social relationships: loss of good friend to suicide Substance abuse: no stressors Bereavement / Loss: anniversary of loss of stillborn child, loss of mother, loss of marriage, death of close friend  Living/Environment/Situation:  Living Arrangements: Spouse/significant other;Children Living conditions (as described by patient or guardian): lives with boyfriend and has joint custody of daughter How long has patient lived in current situation?: 1 year What is atmosphere in current home: Comfortable;Supportive  Family History:  Marital status: Long term relationship Long term relationship, how long?:  7 months What types of issues is patient dealing with in the relationship?: boyfriend is very supportive Additional relationship information: relationship with ex-husband is good - they have come together to co-parent 33 year old daughter Does patient have children?: Yes How many children?: 2  How is patient's relationship with their children?: 1 son stillborn 10 years ago, 1 daughter age 33; good relationship with daughter  Childhood History:  By whom was/is the patient raised?: Mother Additional childhood history information: it was always her, mom and brother in the home and she was always the strong one Description of patient's relationship with caregiver when they were a child: very close with mother Patient's description of current  relationship with people who raised him/her: was caregiver for mother until she died 4 months ago Does patient have siblings?: Yes Number of Siblings: 1  Description of patient's current relationship with siblings: younger brother - very close Did patient suffer any verbal/emotional/physical/sexual abuse as a child?: No Did patient suffer from severe childhood neglect?: No Has patient ever been sexually abused/assaulted/raped as an adolescent or adult?: No Was the patient ever a victim of a crime or a disaster?: No Witnessed domestic violence?: No Has patient been effected by domestic violence as an adult?: No  Education:  Highest grade of school patient has completed: 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 Currently a Consulting civil engineer?: No Learning disability?: No  Employment/Work Situation:   Employment situation: Employed Where is patient currently employed?: [employer] How long has patient been employed?: 3 years Patient's job has been impacted by current illness: No What is the longest time patient has a held a job?: 8 years 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 Has patient ever been in the [employer]?: No Has patient ever served in combat?: No  Financial Resources:   Financial resources: Income from employment Does patient have a representative payee or guardian?: No  Alcohol/Substance Abuse:   What has been your use of drugs/alcohol within the last 12 months?: no substance abuse reported Alcohol/Substance Abuse Treatment Hx: Denies past history If yes, describe treatment: n/a Has alcohol/substance abuse ever caused legal problems?: No  Social Support System:   Conservation officer, nature Support System: Good Describe Community Support System: boyfriend, brother, friends, sometimes ex-husband Type of faith/religion: Baptist/more spirtiural  How does patient's faith help to cope with current illness?: praying   Leisure/Recreation:   Leisure and Hobbies:  being with daughter, loves animals  Strengths/Needs:   What things does the patient  do well?: strong, needs to learn to be more open In what areas does patient struggle / problems for patient: was 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 mother, who died 4 months ago; has a 33 year old and separated from the father 1 year ago, 10 year anniversary of loss of stillborn child, has always had to be the strong one and cannot handle it anymore, friend committed suicide  Discharge Plan:   Does patient have access to transportation?: Yes Will patient be returning to same living situation after discharge?: Yes Currently receiving community mental health services: No If no, would patient like referral for services when discharged?: Yes (What county?) [facility] - but all appointments are in [place]) Does patient have financial barriers related to discharge medications?: No  Summary/Recommendations:   Summary and Recommendations (to be completed by the evaluator): Stacey Pittman is a 31 year old separated female diagnosed with Major Depressive Disorder. She has experienced significant losses and reports that she was always the strong one, but it is getting to be too much for her. States that the upcoming anniversary of her son's stillborn birth and of her separation from husband (high school sweetheart), in addition to recent death of her mother, were too much for her and he attempted suicide so she did not have to deal with them. Reports taht the moment after she took the pills, she realized it was a mistake and called her boyfriend to get her help. Has a 33 year old daughter who she shares custody of, and has a job that she can return to, although it is stressful. Also has support of several people in her life who she can turn to if she needs them, but had not  been letting people know that she needed help. Stacey Pittman would benefit from crisis stabilization, medication evaluation, therapy groups for processing thoughts/feelings/experiences,  psychoed groups for coping skills and case management for discharge planning.   Stacey Pittman, Stacey Pittman. 11/07/2011

## 2011-11-07 NOTE — Progress Notes (Signed)
[facility] [place] MD Progress Note  11/07/2011 5:49 PM  Diagnosis:   Axis I: Major Depression, Recurrent severe and Compulsive Behavior Disorder Axis II: Deferred Axis III:  Past Medical History  Diagnosis Date  . Anxiety   . Depression    Axis IV: other psychosocial or environmental problems Axis V: 21-30 behavior considerably influenced by delusions or hallucinations OR serious impairment in judgment, communication OR inability to function in almost all areas  ADL's:  Intact  Sleep: Fair  Appetite:  Fair  Suicidal Ideation:  Pt comes into hospital after overdose where she had intended to die. Homicidal Ideation:  Pt denies any thoughts, plans, intent of homicide  AEB (as evidenced by): per pt report  Mental Status Examination/Evaluation: Objective:  Appearance: Casual  Eye Contact::  Good  Speech:  Clear and Coherent  Volume:  Normal  Mood:  Anxious, Depressed, Hopeless, Irritable and Worthless  Affect:  Congruent  Thought Process:  Coherent  Orientation:  Full  Thought Content:  WDL  Suicidal Thoughts:  Yes.  without intent/plan  Homicidal Thoughts:  No  Memory:  Immediate;   Fair Recent;   Fair Remote;   Fair  Judgement:  Impaired  Insight:  Lacking  Psychomotor Activity:  Normal  Concentration:  Fair  Recall:  Fair  Akathisia:  No  Handed:  Right  AIMS (if indicated):     Assets:  Communication Skills Desire for Improvement  Sleep:  Number of Hours: 6.5    Vital Signs:Blood pressure 106/75, pulse 72, temperature 98.7 F (37.1 C), temperature source Oral, resp. rate 15, height 5\' 7"  (1.702 m), weight 88.451 kg (195 lb), last menstrual period 10/20/2011. Current Medications: Current Facility-Administered Medications  Medication Dose Route Frequency Provider Last Rate Last Dose  . acetaminophen (TYLENOL) tablet 650 mg  650 mg Oral Q6H PRN Kerry Hough, PA      . alum & mag hydroxide-simeth (MAALOX/MYLANTA) 200-200-20 MG/5ML suspension 30 mL  30 mL Oral Q4H PRN  Kerry Hough, PA      . chlorproMAZINE (THORAZINE) tablet 10 mg  10 mg Oral TID PRN Mike Craze, MD      . citalopram (CELEXA) tablet 30 mg  30 mg Oral Daily Mike Craze, MD      . diphenhydrAMINE (BENADRYL) capsule 25 mg  25 mg Oral QHS,MR X 1 Mike Craze, MD      . levofloxacin [facility]) tablet 500 mg  500 mg Oral Daily Kerry Hough, PA   500 mg at 11/07/11 0933  . magnesium hydroxide (MILK OF MAGNESIA) suspension 30 mL  30 mL Oral Daily PRN Kerry Hough, PA      . DISCONTD: hydrOXYzine (ATARAX/VISTARIL) tablet 50 mg  50 mg Oral QHS,MR X 1 Kerry Hough, PA   50 mg at 11/06/11 2216  . DISCONTD: nicotine (NICODERM CQ - dosed in mg/24 hours) patch 21 mg  21 mg Transdermal Q0600 Kerry Hough, PA       Facility-Administered Medications Ordered in Other Encounters  Medication Dose Route Frequency Provider Last Rate Last Dose  . DISCONTD: acetaminophen (TYLENOL) suppository 650 mg  650 mg Rectal Q6H PRN Ron Parker, MD      . DISCONTD: acetaminophen (TYLENOL) tablet 650 mg  650 mg Oral Q6H PRN Ron Parker, MD      . DISCONTD: alum & mag hydroxide-simeth (MAALOX/MYLANTA) 200-200-20 MG/5ML suspension 30 mL  30 mL Oral Q6H PRN Ron Parker, MD      .  DISCONTD: cefTRIAXone (ROCEPHIN) 1 g in dextrose 5 % 50 mL IVPB  1 g Intravenous Q24H Ron Parker, MD   1 g at 11/06/11 1848  . DISCONTD: enoxaparin (LOVENOX) injection 40 mg  40 mg Subcutaneous Q24H Ron Parker, MD   40 mg at 11/06/11 0911  . DISCONTD: ondansetron (ZOFRAN) injection 4 mg  4 mg Intravenous Q6H PRN Ron Parker, MD      . DISCONTD: ondansetron (ZOFRAN) tablet 4 mg  4 mg Oral Q6H PRN Ron Parker, MD        Lab Results:  Results for orders placed during the hospital encounter of 11/06/11 (from the past 48 hour(s))  TSH     Status: Normal   Collection Time   11/07/11  6:20 AM      Component Value Range Comment   TSH 3.272  0.350 - 4.500 uIU/mL   T4, FREE     Status:  Normal   Collection Time   11/07/11  6:20 AM      Component Value Range Comment   Free T4 0.98  0.80 - 1.80 ng/dL     Physical Findings: AIMS: Facial and Oral Movements Muscles of Facial Expression: None, normal Lips and Perioral Area: None, normal Jaw: None, normal Tongue: None, normal,Extremity Movements Upper (arms, wrists, hands, fingers): None, normal Lower (legs, knees, ankles, toes): None, normal, Trunk Movements Neck, shoulders, hips: None, normal, Overall Severity Severity of abnormal movements (highest score from questions above): None, normal Incapacitation due to abnormal movements: None, normal Patient's awareness of abnormal movements (rate only patient's report): No Awareness, Dental Status Current problems with teeth and/or dentures?: No Does patient usually wear dentures?: No  CIWA:    COWS:  COWS Total Score: 0   Treatment Plan Summary: Daily contact with patient to assess and evaluate symptoms and progress in treatment Medication management Mood/anxiety less than 3/10 where the scale is 1 is the best and 10 is the worst No suicidal or homicidal thoughts for at least 48 hours.  Plan: Admit, restart Celexa for depression, use Benadryl for sedation as it is effective for her. Refer to 12 Step meetings.   Discussed the risks, benefits, and probable clinical course with and without treatment.  Pt is agreeable to the current course of treatment.  Kharisma Glasner 11/07/2011, 5:49 PM

## 2011-11-07 NOTE — Progress Notes (Signed)
D: Patient denies SI/HI. The patient has a depressed mood and an appropriate affect. The patient rates her depression a 4 out of 10 and her hopelessness a 2 out of 10 (1 low/10 high). The patient reports sleeping well and states that her appetite is good but that her energy level is low. The patient is participating on the unit and reports that she needs to "focus on 'her' more and talk to people about 'her' problems."  A: Patient given emotional support from RN. Patient encouraged to come to staff with concerns and/or questions. Patient's medication routine continued. Patient's orders and plan of care reviewed.  R: Patient remains appropriate and cooperative. Will continue to monitor patient q15 minutes for safety.

## 2011-11-07 NOTE — Progress Notes (Signed)
[facility] Group Notes: (Counselor/Nursing/MHT/Case Management/Adjunct) 11/07/2011   1:15-2:30pm Emotion Regulation  Type of Therapy:  Group Therapy  Participation Level:  Active  Participation Quality:  Appropriate, Sharing   Affect:  Blunted  Cognitive:  Appropriate  Insight:  Good  Engagement in Group: Good  Engagement in Therapy:  Good  Modes of Intervention:  Support and Exploration  Summary of Progress/Problems: Belvia explored emotions in her life that she has trouble controlling. She shared that she has many losses and became overwhelmed with the grief. Brandon processed her feeling of failure at not being able to save her marriage, even though she knows getting herself and her daughter out of that situation was healthiest for both of them. She acknowledged that she lost her identity as a wife and daughter at the same time, and is having trouble figuring out who she is.   Angus Palms, LCSW 11/07/2011  3:03 PM

## 2011-11-07 NOTE — Progress Notes (Signed)
Psychoeducational Group Note  Date:  11/07/2011 Time:  1100  Group Topic/Focus:  Personal Choices and Values:   The focus of this group is to help patients assess and explore the importance of values in their lives, how their values affect their decisions, how they express their values and what opposes their expression.  Participation Level:  Active  Participation Quality:  Appropriate, Attentive, Sharing and Supportive  Affect:  Appropriate  Cognitive:  Appropriate  Insight:  Good  Engagement in Group:  Good  Additional Comments:  Pt participated in group of personal values and choices. Pt was asked what were important values that were positive. Pt also asked what were negative values and choices that have been made in the past and how it affected future. Pt complete worksheet that was given out on identifying values, and values-oriented life worksheet.  Pt shared and was appropriate during group.  Stacey Pittman 11/07/2011, 1:27 PM

## 2011-11-07 NOTE — Progress Notes (Signed)
[facility] Group Notes:  (Counselor/Nursing/MHT/Case Management/Adjunct)  11/07/2011 3:22 PM  Type of Therapy:  Psychoeducational Skills  Participation Level:  Minimal  Participation Quality:  Appropriate and Attentive  Affect:  Appropriate and Depressed  Cognitive:  Alert, Appropriate and Oriented  Insight:  Good  Engagement in Group:  Good  Engagement in Therapy:  n/  Modes of Intervention:  Activity, Education, Dentist, Socialization and Support  Summary of Progress/Problems: Shanikia attended psycho education group that focused on using quality time with support systems/individuals to engage in healthy coping skills and stregthen the support relationship. Konnor partcipated in activity guessing about self and peers. Alexxus was called out for a consult and returned tearful but was able to participate some while group discussed who their supports are, how they can spend quality time with them as a coping skill and a way to strengthen the relationship. Jeraldin was given a homework assignment to find two ways to improve their support systems and twenty activities they can do to spend quality time with supports.    Wandra Scot 11/07/2011, 3:22 PM

## 2011-11-07 NOTE — Progress Notes (Signed)
Pt observed on the unit interacting and participating in unit activities.  Pt reports she is feeling better and denies SI/HI/AV.  Pt attended evening group on the 500 hall tonight.  Pt voices no needs/concerns at this time.  She was restarted on Celexa and ordered Benadryl for sleep.  She hopes to gain skills in helping her with the grieving process d/t the multiple losses in her life especially her mother 4 mons ago and a close friend who suicided recently.  Pt has been appropriate/pleasant.  Pt given a specimen cup for collection in the AM to recheck for UTI.  Encouraged pt to make her needs known to staff.  Safety maintained with q15 minute checks.

## 2011-11-07 NOTE — Progress Notes (Signed)
[facility] Group Notes:  (Counselor/Nursing/MHT/Case Management/Adjunct)  11/07/2011 8:00PM  Type of Therapy:  Group Therapy  Participation Level:  Active  Participation Quality:  Attentive, Sharing and Supportive  Affect:  Appropriate  Cognitive:  Alert and Oriented  Insight:  Good  Engagement in Group:  Good  Engagement in Therapy:  Good  Modes of Intervention:  Education, Problem-solving and Support  Summary of Progress/Problems: Pt stated that her reason for being here was because she overdosed. Pt verbalized that she has had a lot of losses recently. Pt stated that something she will be doing differently is realizing that she cant do it alone. Pt stated that she would try to open up more. Pt was able to rate her day as a 7.  Stacey Pittman, Randal Buba 11/07/2011, 9:51 PM

## 2011-11-07 NOTE — Tx Team (Signed)
Interdisciplinary Treatment Plan Update (Adult)  Date:  11/07/2011  Time Reviewed:  10:39 AM   Progress in Treatment: Attending groups: Yes Participating in groups:  Yes Taking medication as prescribed: Yes Tolerating medication:  Yes Family/Significant other contact made:  Counselor assessing for appropriate contact Patient understands diagnosis:  Yes Discussing patient identified problems/goals with staff:  Yes Medical problems stabilized or resolved:  Yes Denies suicidal/homicidal ideation: Yes Issues/concerns per patient self-inventory:  None identified Other: N/A  New problem(s) identified: None Identified  Reason for Continuation of Hospitalization: Anxiety Depression Medication stabilization Suicidal ideation  Interventions implemented related to continuation of hospitalization: mood stabilization, medication monitoring and adjustment, group therapy and psycho education, safety checks q 15 mins  Additional comments: N/A  Estimated length of stay: 3-5 days  Discharge Plan: SW will assess for appropriate referrals.    New goal(s): N/A  Review of initial/current patient goals per problem list:    1.  Goal(s): Reduce depressive symptoms  Met:  No  Target date: by discharge  As evidenced by: Reducing depression from a 10 to a 3 as reported by pt.   2.  Goal (s): Reduce/Eliminate suicidal ideation  Met:  No  Target date: by discharge  As evidenced by: pt reporting no SI.    3.  Goal(s): Reduce anxiety symptoms  Met:  No  Target date: by discharge  As evidenced by: Reduce anxiety from a 10 to a 3 as reported by pt.    Attendees: Patient:  Stacey Pittman  11/07/2011 10:39 AM   Family:     Physician:  Orson Aloe, MD  11/07/2011  10:39 AM   Nursing:   Leighton Parody, RN 11/07/2011 10:40 AM   Case Manager:  Reyes Ivan, LCSWA 11/07/2011  10:39 AM   Counselor:  Angus Palms, LCSW 11/07/2011  10:39 AM   Other:  Juline Patch, LCSW 11/07/2011  10:39 AM     Other:  Nestor Ramp, RN 11/07/2011 10:41 AM   Other:  Boykin Nearing, MSW intern 11/07/2011 10:41 AM   Other:      Scribe for Treatment Team:   Carmina Miller, 11/07/2011 , 10:39 AM

## 2011-11-07 NOTE — Progress Notes (Signed)
Pt attended discharge planning group and actively participated in group.  SW provided pt with today's workbook.  Pt presents with calm mood and affect.  Pt was open with sharing reason for entering the hospital.  Pt states that she recently lost her mom who she cared for and separated from her husband a year ago.  Pt states that they were together since she was 31 years old and is sad about this loss.  Pt states that the 10 year anniversary of the loss of her son is in October.  Pt states that she is also stressed with daily life.  Pt was tearful when sharing all of these losses.  Pt states that she lives in [place] with her boyfriend and 38 year old daughter.  Pt states that she works in [place].  Pt has transportation.  Pt states that she doesn't have a psychiatrist or therapist but would be open to a referral.  SW will assess for appropriate referrals.  No further needs voiced by pt at this time.    Reyes Ivan, LCSWA 11/07/2011  10:48 AM

## 2011-11-07 NOTE — [facility] Suicide Risk Assessment (Signed)
Suicide Risk Assessment  Admission Assessment     Nursing information obtained from:    Demographic factors:    Current Mental Status:    Loss Factors:    Historical Factors:    Risk Reduction Factors:     CLINICAL FACTORS:   Severe Anxiety and/or Agitation Depression:   Anhedonia Insomnia Previous Psychiatric Diagnoses and Treatments  COGNITIVE FEATURES THAT CONTRIBUTE TO RISK:  Thought constriction (tunnel vision)    SUICIDE RISK:   Moderate:  Frequent suicidal ideation with limited intensity, and duration, some specificity in terms of plans, no associated intent, good self-control, limited dysphoria/symptomatology, some risk factors present, and identifiable protective factors, including available and accessible social support.  Reason for hospitalization: .Suicuide attempt by overdose Diagnosis:   Axis I: Major Depression, Recurrent severe and Compulsive Behavior Disorder Axis II: Deferred Axis III:  Past Medical History  Diagnosis Date  . Anxiety   . Depression    Axis IV: other psychosocial or environmental problems Axis V: 21-30 behavior considerably influenced by delusions or hallucinations OR serious impairment in judgment, communication OR inability to function in almost all areas  ADL's:  Intact  Sleep: Fair  Appetite:  Fair  Suicidal Ideation:  Pt comes into hospital after overdose where she had intended to die. Homicidal Ideation:  Pt denies any thoughts, plans, intent of homicide  AEB (as evidenced by): per pt report  Mental Status Examination/Evaluation: Objective:  Appearance: Casual  Eye Contact::  Good  Speech:  Clear and Coherent  Volume:  Normal  Mood:  Anxious, Depressed, Hopeless, Irritable and Worthless  Affect:  Congruent  Thought Process:  Coherent  Orientation:  Full  Thought Content:  WDL  Suicidal Thoughts:  Yes.  without intent/plan  Homicidal Thoughts:  No  Memory:  Immediate;   Fair Recent;   Fair Remote;   Fair    Judgement:  Impaired  Insight:  Lacking  Psychomotor Activity:  Normal  Concentration:  Fair  Recall:  Fair  Akathisia:  No  Handed:  Right  AIMS (if indicated):     Assets:  Communication Skills Desire for Improvement  Sleep:  Number of Hours: 6.5    Vital Signs:Blood pressure 106/75, pulse 72, temperature 98.7 F (37.1 C), temperature source Oral, resp. rate 15, height 5\' 7"  (1.702 m), weight 88.451 kg (195 lb), last menstrual period 10/20/2011. Current Medications: Current Facility-Administered Medications  Medication Dose Route Frequency Provider Last Rate Last Dose  . acetaminophen (TYLENOL) tablet 650 mg  650 mg Oral Q6H PRN Kerry Hough, PA      . alum & mag hydroxide-simeth (MAALOX/MYLANTA) 200-200-20 MG/5ML suspension 30 mL  30 mL Oral Q4H PRN Kerry Hough, PA      . chlorproMAZINE (THORAZINE) tablet 10 mg  10 mg Oral TID PRN Mike Craze, MD      . citalopram (CELEXA) tablet 30 mg  30 mg Oral Daily Mike Craze, MD      . diphenhydrAMINE (BENADRYL) capsule 25 mg  25 mg Oral QHS,MR X 1 Mike Craze, MD      . levofloxacin [facility]) tablet 500 mg  500 mg Oral Daily Kerry Hough, PA   500 mg at 11/07/11 0933  . magnesium hydroxide (MILK OF MAGNESIA) suspension 30 mL  30 mL Oral Daily PRN Kerry Hough, PA      . DISCONTD: hydrOXYzine (ATARAX/VISTARIL) tablet 50 mg  50 mg Oral QHS,MR X 1 Spencer E Simon, PA   50 mg at  11/06/11 2216  . DISCONTD: nicotine (NICODERM CQ - dosed in mg/24 hours) patch 21 mg  21 mg Transdermal Q0600 Kerry Hough, PA       Facility-Administered Medications Ordered in Other Encounters  Medication Dose Route Frequency Provider Last Rate Last Dose  . DISCONTD: acetaminophen (TYLENOL) suppository 650 mg  650 mg Rectal Q6H PRN Ron Parker, MD      . DISCONTD: acetaminophen (TYLENOL) tablet 650 mg  650 mg Oral Q6H PRN Ron Parker, MD      . DISCONTD: alum & mag hydroxide-simeth (MAALOX/MYLANTA) 200-200-20 MG/5ML suspension  30 mL  30 mL Oral Q6H PRN Ron Parker, MD      . DISCONTD: cefTRIAXone (ROCEPHIN) 1 g in dextrose 5 % 50 mL IVPB  1 g Intravenous Q24H Ron Parker, MD   1 g at 11/06/11 1848  . DISCONTD: enoxaparin (LOVENOX) injection 40 mg  40 mg Subcutaneous Q24H Ron Parker, MD   40 mg at 11/06/11 0911  . DISCONTD: ondansetron (ZOFRAN) injection 4 mg  4 mg Intravenous Q6H PRN Ron Parker, MD      . DISCONTD: ondansetron (ZOFRAN) tablet 4 mg  4 mg Oral Q6H PRN Ron Parker, MD        Lab Results:  Results for orders placed during the hospital encounter of 11/06/11 (from the past 48 hour(s))  TSH     Status: Normal   Collection Time   11/07/11  6:20 AM      Component Value Range Comment   TSH 3.272  0.350 - 4.500 uIU/mL   T4, FREE     Status: Normal   Collection Time   11/07/11  6:20 AM      Component Value Range Comment   Free T4 0.98  0.80 - 1.80 ng/dL     Physical Findings: AIMS: Facial and Oral Movements Muscles of Facial Expression: None, normal Lips and Perioral Area: None, normal Jaw: None, normal Tongue: None, normal,Extremity Movements Upper (arms, wrists, hands, fingers): None, normal Lower (legs, knees, ankles, toes): None, normal, Trunk Movements Neck, shoulders, hips: None, normal, Overall Severity Severity of abnormal movements (highest score from questions above): None, normal Incapacitation due to abnormal movements: None, normal Patient's awareness of abnormal movements (rate only patient's report): No Awareness, Dental Status Current problems with teeth and/or dentures?: No Does patient usually wear dentures?: No  CIWA:    COWS:  COWS Total Score: 0   Risk: Risk of harm to self is elevated by her recent suicide attempt and her depression as well as lifetime accumulated losses.  Risk of harm to others is minimal in that she has not been involved in fights or had any legal charges filed on her.  Treatment Plan Summary: Daily contact with  patient to assess and evaluate symptoms and progress in treatment Medication management Mood/anxiety less than 3/10 where the scale is 1 is the best and 10 is the worst No suicidal or homicidal thoughts for at least 48 hours.  Plan: Admit, restart Celexa for depression, use Benadryl for sedation as it is effective for her. Refer to 12 Step meetings.   Discussed the risks, benefits, and probable clinical course with and without treatment.  Pt is agreeable to the current course of treatment. We will continue on q. 15 checks the unit protocol. At this time there is no clinical indication for one-to-one observation as patient contract for safety and presents little risk to harm themself and others.  We  will increase collateral information. I encourage patient to participate in group milieu therapy. Pt will be seen in treatment team soon for further treatment and appropriate discharge planning. Please see history and physical note for more detailed information ELOS: 3 to 5 days.   Ac Colan 11/07/2011, 5:57 PM

## 2011-11-08 DIAGNOSIS — T424X4A Poisoning by benzodiazepines, undetermined, initial encounter: Secondary | ICD-10-CM

## 2011-11-08 DIAGNOSIS — T43591A Poisoning by other antipsychotics and neuroleptics, accidental (unintentional), initial encounter: Secondary | ICD-10-CM

## 2011-11-08 DIAGNOSIS — T43204A Poisoning by unspecified antidepressants, undetermined, initial encounter: Secondary | ICD-10-CM

## 2011-11-08 LAB — URINALYSIS, ROUTINE W REFLEX MICROSCOPIC
Bilirubin Urine: NEGATIVE
Glucose, UA: NEGATIVE mg/dL
Hgb urine dipstick: NEGATIVE
Protein, ur: NEGATIVE mg/dL
Urobilinogen, UA: 0.2 mg/dL (ref 0.0–1.0)

## 2011-11-08 LAB — URINE MICROSCOPIC-ADD ON

## 2011-11-08 MED ORDER — CHLORPROMAZINE HCL 10 MG PO TABS
10.0000 mg | ORAL_TABLET | Freq: Three times a day (TID) | ORAL | Status: DC
  Administered 2011-11-08 – 2011-11-09 (×4): 10 mg via ORAL
  Filled 2011-11-08 (×10): qty 1

## 2011-11-08 MED ORDER — DIPHENHYDRAMINE HCL 50 MG PO CAPS
50.0000 mg | ORAL_CAPSULE | Freq: Every evening | ORAL | Status: DC | PRN
  Administered 2011-11-08: 50 mg via ORAL
  Filled 2011-11-08 (×2): qty 1
  Filled 2011-11-08: qty 4
  Filled 2011-11-08: qty 1
  Filled 2011-11-08: qty 4
  Filled 2011-11-08: qty 1
  Filled 2011-11-08 (×2): qty 4

## 2011-11-08 NOTE — Progress Notes (Signed)
[facility] MD Progress Note  11/08/2011 3:11 PM  S: "I am here because of accumulation of emotions. My mother passed 4 months ago after a long battle with an incurable medical conditions. I lost a son 10 years ago. I am going through financial ruin. I got very stressed and overwhelmed. So, I made an instant decision to just end my life. I tried to end my life by taking a lot of Trazodone pills. I came out of the bathroom, realized what I had just done, and told my boyfriend. He called 911, I was taken and kept at the hospital x 2 days and then brought in here. My mood is better. I am doing fairly well, just going through emotional turmoil".  Diagnosis:   Axis I: Bereavement and Overdose of benzodiazepine, Overdose on antidepressants. Axis II: Deferred Axis III:  Past Medical History  Diagnosis Date  . Anxiety   . Depression    Axis IV: other psychosocial or environmental problems Axis V: 41-50 serious symptoms  ADL's:  Intact  Sleep: Good  Appetite:  Good  Suicidal Ideation: "No" Plan:  No Intent:  No Means:  no Homicidal Ideation: "No" Plan:  No Intent:  no Means:  Nono  AEB (as evidenced by): per patient's reports.  Mental Status Examination/Evaluation: Objective:  Appearance: Casual  Eye Contact::  Good  Speech:  Clear and Coherent  Volume:  Normal  Mood:  Depressed  Affect:  Tearful  Thought Process:  Coherent and Intact  Orientation:  Full  Thought Content:  Rumination  Suicidal Thoughts:  No  Homicidal Thoughts:  No  Memory:  Immediate;   Good Recent;   Good Remote;   Good  Judgement:  Fair  Insight:  Fair  Psychomotor Activity:  Normal  Concentration:  Good  Recall:  Good  Akathisia:  No  Handed:  Right  AIMS (if indicated):     Assets:  Desire for Improvement  Sleep:  Number of Hours: 6.25    Vital Signs:Blood pressure 106/75, pulse 72, temperature 98.7 F (37.1 C), temperature source Oral, resp. rate 15, height 5\' 7"  (1.702 m), weight 88.451 kg (195  lb), last menstrual period 10/20/2011. Current Medications: Current Facility-Administered Medications  Medication Dose Route Frequency Provider Last Rate Last Dose  . acetaminophen (TYLENOL) tablet 650 mg  650 mg Oral Q6H PRN Kerry Hough, PA      . alum & mag hydroxide-simeth (MAALOX/MYLANTA) 200-200-20 MG/5ML suspension 30 mL  30 mL Oral Q4H PRN Kerry Hough, PA      . chlorproMAZINE (THORAZINE) tablet 10 mg  10 mg Oral TID PRN Mike Craze, MD      . citalopram (CELEXA) tablet 30 mg  30 mg Oral Daily Mike Craze, MD   30 mg at 11/08/11 0733  . diphenhydrAMINE (BENADRYL) capsule 25 mg  25 mg Oral QHS,MR X 1 Mike Craze, MD   25 mg at 11/07/11 2146  . ferrous sulfate tablet 325 mg  325 mg Oral BID WC Mike Craze, MD   325 mg at 11/08/11 0809  . levofloxacin (LEVAQUIN) tablet 500 mg  500 mg Oral Daily Kerry Hough, PA   500 mg at 11/08/11 0733  . magnesium hydroxide (MILK OF MAGNESIA) suspension 30 mL  30 mL Oral Daily PRN Kerry Hough, PA      . DISCONTD: hydrOXYzine (ATARAX/VISTARIL) tablet 50 mg  50 mg Oral QHS,MR X 1 Kerry Hough, PA   50 mg at 11/06/11  2216    Lab Results:  Results for orders placed during the hospital encounter of 11/06/11 (from the past 48 hour(s))  TSH     Status: Normal   Collection Time   11/07/11  6:20 AM      Component Value Range Comment   TSH 3.272  0.350 - 4.500 uIU/mL   T4, FREE     Status: Normal   Collection Time   11/07/11  6:20 AM      Component Value Range Comment   Free T4 0.98  0.80 - 1.80 ng/dL   URINALYSIS, ROUTINE W REFLEX MICROSCOPIC     Status: Abnormal   Collection Time   11/08/11  5:45 AM      Component Value Range Comment   Color, Urine YELLOW  YELLOW    APPearance CLEAR  CLEAR    Specific Gravity, Urine 1.018  1.005 - 1.030    pH 7.0  5.0 - 8.0    Glucose, UA NEGATIVE  NEGATIVE mg/dL    Hgb urine dipstick NEGATIVE  NEGATIVE    Bilirubin Urine NEGATIVE  NEGATIVE    Ketones, ur NEGATIVE  NEGATIVE mg/dL     Protein, ur NEGATIVE  NEGATIVE mg/dL    Urobilinogen, UA 0.2  0.0 - 1.0 mg/dL    Nitrite NEGATIVE  NEGATIVE    Leukocytes, UA TRACE (*) NEGATIVE   URINE MICROSCOPIC-ADD ON     Status: Normal   Collection Time   11/08/11  5:45 AM      Component Value Range Comment   Squamous Epithelial / LPF RARE  RARE    WBC, UA 0-2  <3 WBC/hpf    Bacteria, UA RARE  RARE     Physical Findings: AIMS: Facial and Oral Movements Muscles of Facial Expression: None, normal Lips and Perioral Area: None, normal Jaw: None, normal Tongue: None, normal,Extremity Movements Upper (arms, wrists, hands, fingers): None, normal Lower (legs, knees, ankles, toes): None, normal, Trunk Movements Neck, shoulders, hips: None, normal, Overall Severity Severity of abnormal movements (highest score from questions above): None, normal Incapacitation due to abnormal movements: None, normal Patient's awareness of abnormal movements (rate only patient's report): No Awareness, Dental Status Current problems with teeth and/or dentures?: No Does patient usually wear dentures?: No  CIWA:    COWS:  COWS Total Score: 0   Treatment Plan Summary: Daily contact with patient to assess and evaluate symptoms and progress in treatment Medication management  Plan: No changes made on current treatment regimen. Will need referral to an outpatient psychotherapy after discharge. Continue current treatment plan.  Armandina Stammer I 11/08/2011, 3:11 PM

## 2011-11-08 NOTE — Progress Notes (Signed)
   [facility] Group Notes: (Counselor/Nursing/MHT/Case Management/Adjunct) 11/08/2011   @1 :15pm [employer] [facility] in [facility]  Type of Therapy:  Group Therapy  Participation Level:  Good  Participation Quality:  Good  Affect:  Appropriate  Cognitive:  Appropriate  Insight:  Good  Engagement in Group:  Good  Engagement in Therapy:  Good  Modes of Intervention:  Support and Exploration  Summary of Progress/Problems:  Stacey Pittman participated with speaker from [employer] [place] and expressed interest in programs [employer] offers. She processed her experience in listening to the speaker's personal story, and shared that she particularly related to speaker's feelings about parenting with depression. Stacey Pittman stated that hearing the story normalized her feelings and experiences with her own children, and she indicated that she plans to attend some [employer] sponsored programs, as well as volunteering at the association.    Billie Lade 11/08/2011  4:10 PM

## 2011-11-08 NOTE — Progress Notes (Signed)
Patient ID: Stacey Pittman, female   DOB: 12-03-1980, 31 y.o.   MRN: [id number]  Problem: Depression, Suicidal  D: Pt calm, cooperative and interacting well with peers in milieu.  A: Monitor patient Q 15 minutes for safety, encourage continued interaction with peers. Administer medications as ordered by MD.  R: Pt participated in group session, pt compliant with medications. Pt denies SI at this time. No inappropriate behaviors noted during shift.

## 2011-11-08 NOTE — Progress Notes (Signed)
D: Patient denies SI/HI. The patient has a depressed mood and an appropriate affect. The patient rates her depression a 3 out of 10 and her hopelessness a 2 out of 10 (1 low/10 high). The patient reports sleeping poorly and that her energy level is low but states that her appetite is is good. The patient is attending groups on the unit and is interacting appropriately.   A: Patient given emotional support from RN. Patient encouraged to come to staff with concerns and/or questions. Patient's medication routine continued. Patient's orders and plan of care reviewed.  R: Patient remains appropriate and cooperative. Will continue to monitor patient q15 minutes for safety.

## 2011-11-08 NOTE — Progress Notes (Signed)
I agree with this note.  

## 2011-11-08 NOTE — Progress Notes (Signed)
Pt attended discharge planning group and actively participated in group.  SW provided pt with today's workbook.  Pt presents with calm mood and affect.  Pt rates depression at a 3 and anxiety at a 5 today.  Pt denies SI/HI.  Pt states that she wants to follow up in [place] due to her work schedule.  SW will assess for appropriate referrals.  Pt states that she is also interested in [employer] in the [place].  SW put a list of meetings in pt's chart for d/c.  Pt states that she is interested in [employer].  SW also provided pt with info on them. No further needs voiced by pt at this time.  Safety planning and suicide prevention discussed.  Pt participated in discussion and acknowledged an understanding of the information provided.       Reyes Ivan, LCSWA 11/08/2011  10:07 AM

## 2011-11-09 DIAGNOSIS — T438X2A Poisoning by other psychotropic drugs, intentional self-harm, initial encounter: Secondary | ICD-10-CM

## 2011-11-09 DIAGNOSIS — Z634 Disappearance and death of family member: Secondary | ICD-10-CM

## 2011-11-09 DIAGNOSIS — F1910 Other psychoactive substance abuse, uncomplicated: Secondary | ICD-10-CM

## 2011-11-09 MED ORDER — DIPHENHYDRAMINE HCL 50 MG PO CAPS: ORAL_CAPSULE | ORAL | Status: DC

## 2011-11-09 MED ORDER — FERROUS SULFATE 325 (65 FE) MG PO TABS: 325.0000 mg | ORAL_TABLET | Freq: Two times a day (BID) | ORAL | Status: DC

## 2011-11-09 MED ORDER — CHLORPROMAZINE HCL 10 MG PO TABS: 10.0000 mg | ORAL_TABLET | Freq: Three times a day (TID) | ORAL | Status: DC | PRN

## 2011-11-09 MED ORDER — CITALOPRAM HYDROBROMIDE 10 MG PO TABS: 20.0000 mg | ORAL_TABLET | Freq: Every day | ORAL | Status: AC

## 2011-11-09 NOTE — Progress Notes (Signed)
11/09/2011      Time: 1500      Group Topic/Focus: The focus of this group is on discussing various styles of communication and communicating assertively using 'I' (feeling) statements.  Participation Level: Active  Participation Quality: Attentive and Supportive  Affect: Appropriate  Cognitive: Appropriate   Additional Comments: Patient reports she is looking forward to discharge today, was able to offer appropriate advice to peers. Patient reports she has always had a habit of trying to help others, but now wants to focus on herself and her daughter.    Stacey Pittman 11/09/2011 4:00 PM

## 2011-11-09 NOTE — Progress Notes (Signed)
Pt discharged per MD orders; pt currently denies SI/HI and auditory/visual hallucinations; pt was given education by RN regarding follow-up appointments and medications and pt denied any questions or concerns about these instructions; pt was then escorted to search room to retrieve her belongings by RN before being discharged to hospital lobby. 

## 2011-11-09 NOTE — Progress Notes (Signed)
[facility] Group Notes: (Counselor/Nursing/MHT/Case Management/Adjunct) 11/09/2011   @1 :15pm Breathing & Meditation for Anxiety/Anger   Type of Therapy:  Group Therapy  Participation Level:  Active  Participation Quality: Appropriate, Sharing, Supportive   Affect:  Appropriate  Cognitive:  Appropriate  Insight:  Good  Engagement in Group: Good  Engagement in Therapy:  Good  Modes of Intervention:  Support and Exploration  Summary of Progress/Problems: Stacey Pittman was very engaged with focused breathing and muscle relaxation exercises. She shared that she has done meditations before and felt very relaxed but them, but today she felt energized. Stacey Pittman related this to the fact that she has been unable to be as physically active while in the hospital and her muscles miss the exercise. She was very insightful and knew quite a bit about alternative therapies, which she used to suggest to other group members. Stacey Pittman was also very supportive to other group members who are struggling.  Angus Palms, LCSW 11/09/2011  2:44 PM

## 2011-11-09 NOTE — Tx Team (Signed)
Interdisciplinary Treatment Plan Update (Adult)  Date:  11/09/2011  Time Reviewed:  9:48 AM   Progress in Treatment: Attending groups: Yes Participating in groups:  Yes Taking medication as prescribed: Yes Tolerating medication:  Yes Family/Significant other contact made: Counselor attempting contact today Patient understands diagnosis:  Yes Discussing patient identified problems/goals with staff:  Yes Medical problems stabilized or resolved:  Yes Denies suicidal/homicidal ideation: Yes Issues/concerns per patient self-inventory:  None identified Other: N/A  New problem(s) identified: None Identified  Reason for Continuation of Hospitalization: Stable to d/c  Interventions implemented related to continuation of hospitalization: Stable to d/c  Additional comments: N/A  Estimated length of stay: D/C today  Discharge Plan: Pt will follow up with [facility] for [facility] management and therapy.    New goal(s): N/A  Review of initial/current patient goals per problem list:    1.  Goal(s): Reduce depressive symptoms  Met:  Yes  Target date: by discharge  As evidenced by: Reducing depression from a 10 to a 3 as reported by pt.  Pt rates at a 1 today.   2.  Goal (s): Reduce/Eliminate suicidal ideation  Met:  Yes  Target date: by discharge  As evidenced by: pt reporting no SI.    3.  Goal(s): Reduce anxiety symptoms  Met:  Yes  Target date: by discharge  As evidenced by: Reduce anxiety from a 10 to a 3 as reported by pt. Pt rates at a 1 today.     Attendees: Patient:  Stacey Pittman 11/09/2011 9:48 AM   Family:     Physician:  Orson Aloe, MD 11/09/2011 9:48 AM   Nursing:   Alease Frame, RN 11/09/2011 9:48 AM   Case Manager:  Reyes Ivan, LCSWA 11/09/2011 9:48 AM   Counselor:  Angus Palms, LCSW 11/09/2011 9:48 AM   Other:  Boykin Nearing, MSW intern 11/09/2011 9:48 AM   Other:    Other:     Other:      Scribe for Treatment  Team:   Carmina Miller, 11/09/2011 9:48 AM

## 2011-11-09 NOTE — Discharge Summary (Signed)
Physician Discharge Summary Note  Patient:  Stacey Pittman is an 31 y.o., female MRN:  [id number] DOB:  1980-08-19 Patient phone:  [phone] (home)  Patient address:   [address] [place] [address]   Date of Admission:  11/06/2011 Date of Discharge: 11/09/2011  Discharge Diagnoses: Active Problems:  * No active hospital problems. *   Axis Diagnosis:  Axis I: Bereavement and Overdose of benzodiazepine, Overdose on antidepressants.  Axis II: Deferred  Axis III:  Past Medical History   Diagnosis  Date   .  Anxiety    .  Depression     Axis IV: other psychosocial or environmental problems  Axis V: 61-70 mild symptoms   Level of Care:  OP  Hospital Course:   Patient was admitted to [facility] ER after she contacted her boyfriend, notifying him of her having impulsively taken 30 Celexa, 50 trazodone, and 5 lorazepam tablets. The boyfriend called 911, who picked the patient up transporting her to [facility] ER.  Pt states that she has had a 4 month history of increasing anxiety and depression since the death of her mother 04/27/2013which she has cared for 2 years as she battled Creuzfeldt-Jakob disease. Patient admonishes the death of her mother, plus upcoming October anniversaries of both an 1 year separation from husband of 16 years and the 07-19-22 anniversary of the death of her full term infant son inutero from porencephaly. Patient feels the compilation of these stressors along with financial stress, estate problems, car problems, loss of relationship with in-laws (whom she had been close), and her families thought of her needing to "put on a brave face.Marland Kitchenand be over my multiple losses" lead to her feeling overwhelmed, hopeless, fatigued, crying spells, hypersomnia without feeling rested. Patient also indicated that she has been experiencing some mood liability in conjunction with manic symptoms such as hypersexualized behavior, And "spurts" of increased energy mixed with emotional "highs" and  "lows".  Patient states that although she attempted suicide by impulsively taking pills, she no longer feels suicidal and is remorseful with her impulsivity. She states that her anxiety is currently 3/10, Depression 4/10, Hopelessness 2/10. She denies homicidality.  While a patient in this hospital, Ms. Stacey Pittman received medication management for depression, anxiety, insomnia, low iron. They were ordered and received Celexa, Thorazine, Benadryl, Iron for these conditions. They were also enrolled in group counseling sessions and activities in which they participated actively.   Patient attended treatment team meeting this am and met with treatment team members. Pt symptoms, treatment plan and response to treatment discussed. Ms. Stacey Pittman endorsed that their symptoms have improved. Pt also stated that they are stable for discharge.  They reported that from this hospital stay they had learned that it is okay to ask for help, that they don't have to do everything themselves, that others have gone through similar things, and that they are okay. .  In other to maintain their mood and sleep, they will continue psychiatric care on outpatient basis. They will follow-up at [facility] in [facility] on 9/17 with Stacey Pittman at 1545 and on 9/23 with Stacey Pittman foe medication management at 1500.  In addition they were instructed to seek out [facility] for support in becoming independent again, to take all your medications as prescribed by your mental healthcare provider, to report any adverse effects and or reactions from your medicines to your outpatient provider promptly, patient is instructed and cautioned to not engage in alcohol and or illegal  drug use while on prescription medicines, in the event of worsening symptoms, patient is instructed to call the crisis hotline, 911 and or go to the nearest ED for appropriate evaluation and treatment of symptoms.   Upon discharge, patient adamantly  denies suicidal, homicidal ideations, auditory, visual hallucinations and or delusional thinking. They left [facility] with all personal belongings via personal transportation in no apparent distress.  Consults:  None  Significant Diagnostic Studies:  labs: CBC, UA, Thyroid tests non contributory  Discharge Vitals:   Blood pressure 115/76, pulse 71, temperature 98.2 F (36.8 C), temperature source Oral, resp. rate 16, height 5\' 7"  (1.702 m), weight 88.451 kg (195 lb), last menstrual period 10/20/2011..  Mental Status Exam: See Mental Status Examination and Suicide Risk Assessment completed by Attending Physician prior to discharge.  Discharge destination:  Home  Is patient on multiple antipsychotic therapies at discharge:  No  Has Patient had three or more failed trials of antipsychotic monotherapy by history: N/A Recommended Plan for Multiple Antipsychotic Therapies: N/A    Medication List     As of 11/09/2011  1:30 PM    STOP taking these medications         LORazepam 0.5 MG tablet   Commonly known as: ATIVAN      traZODone 50 MG tablet   Commonly known as: DESYREL      TAKE these medications      Indication    cetirizine 10 MG tablet   Commonly known as: ZYRTEC   Take 10 mg by mouth daily as needed. For allergies       chlorproMAZINE 10 MG tablet   Commonly known as: THORAZINE   Take 1 tablet (10 mg total) by mouth 3 (three) times daily as needed (anxiety).    Indication: anxiety      citalopram 10 MG tablet   Commonly known as: CELEXA   Take 2 tablets (20 mg total) by mouth daily.    Indication: Depression      diphenhydrAMINE 50 MG capsule   Commonly known as: BENADRYL   Take by mouth 1 to 2 at bed for insomnia       ferrous sulfate 325 (65 FE) MG tablet   Take 1 tablet (325 mg total) by mouth 2 (two) times daily with a meal. For iron replacement.       ibuprofen 200 MG tablet   Commonly known as: ADVIL,MOTRIN   Take 600 mg by mouth every 6 (six) hours as  needed. For pain       metroNIDAZOLE 0.75 % gel   Commonly known as: METROGEL   Apply 1 application topically daily. To face            Follow-up Information    Follow up with [facility] in [place]. On 11/13/2011. (Appointment scheduled 3:45 pm with therapist Stacey Pittman, bring new patient forms with you!)    Contact information:   [address] [place] [address] [place], [place] [address] [phone] fax: [phone]      Follow up with [facility]. On 11/19/2011. (Appointment scheduled at 3:00 pm with Stacey Pittman (medication management))    Contact information:   [address] [place], [place] [address] [phone] fax: [phone]        Follow-up recommendations:   Activities: Resume typical activities Diet: Resume typical diet Tests: none Other: Follow up with outpatient provider and report any side effects to out patient prescriber.  Comments:  Take all your medications as  prescribed by your mental healthcare provider. Report any adverse effects and or reactions from your medicines to your outpatient provider promptly. Patient is instructed and cautioned to not engage in alcohol and or illegal drug use while on prescription medicines. In the event of worsening symptoms, patient is instructed to call the crisis hotline, 911 and or go to the nearest ED for appropriate evaluation and treatment of symptoms.  SignedDan Humphreys, Liahna Brickner 11/09/2011 1:30 PM

## 2011-11-09 NOTE — Progress Notes (Signed)
[facility] Case Management Discharge Plan:  Will you be returning to the same living situation after discharge: Yes,  returning to own home At discharge, do you have transportation home?:Yes,  boyfriend to pick pt up Do you have the ability to pay for your medications:Yes,  access to meds   Release of information consent forms completed and in the chart;  Patient's signature needed at discharge.  Patient to Follow up at:  Follow-up Information    Follow up with [facility] in [place]. On 11/13/2011. (Appointment scheduled 3:45 pm with therapist Lenetta Quaker, bring new patient forms with you!)    Contact information:   [address] [place], [place] [address] [phone] fax: [phone]      Follow up with [facility]. On 11/19/2011. (Appointment scheduled at 3:00 pm with Romona Curls (medication management))    Contact information:   [address] [place], [place] [address] [phone] fax: [phone]         Patient denies SI/HI:   Yes,  denies SI/HI    Safety Planning and Suicide Prevention discussed:  Yes,  discussed with pt today  Barrier to discharge identified:No.  Summary and Recommendations: Pt attended discharge planning group and actively participated in group.  SW provided pt with today's workbook.  Pt presents with calm mood and affect.  Pt rates depression and anxiety at a 1 today.  Pt denies SI/HI.  Pt reports feeling stable to d/c today.  No recommendations from SW.  No further needs voiced by pt.  Pt stable to discharge.     Carmina Miller 11/09/2011, 9:46 AM

## 2011-11-09 NOTE — Progress Notes (Signed)
[facility] Adult Inpatient Family/Significant Other Suicide Prevention Education  Suicide Prevention Education:  Education Completed; Daneil Dolin, brother, [phone] been identified by the patient as the family member/significant other with whom the patient will be residing, and identified as the person(s) who will aid the patient in the event of a mental health crisis (suicidal ideations/suicide attempt).  With written consent from the patient, the family member/significant other has been provided the following suicide prevention education, prior to the and/or following the discharge of the patient.  The suicide prevention education provided includes the following:  Suicide risk factors  Suicide prevention and interventions  National Suicide Hotline telephone number  [facility] assessment telephone number  [facility] Emergency Assistance 911  [facility] and/or Residential Mobile Crisis Unit telephone number  Request made of family/significant other to:  Remove weapons (e.g., guns, rifles, knives), all items previously/currently identified as safety concern.    Remove drugs/medications (over-the-counter, prescriptions, illicit drugs), all items previously/currently identified as a safety concern.  Reuel Boom reported that he has never known Altie to be suicidal in the past, and that he believes this experience really opened her eyes and scared her. He stated that he does not believe she is a danger to herself or anyone else, and that she does not have access to firearms. Reuel Boom was interested in knowing that Jocelin has follow up appointments and stated that he would support her in further therapy. Reuel Boom verbalized understanding of suicide prevention and had no further questions.    Billie Lade 11/09/2011, 11:51 AM

## 2011-11-09 NOTE — [facility] Suicide Risk Assessment (Signed)
Suicide Risk Assessment  Discharge Assessment     Demographic Factors:  Divorced or widowed  Current Mental Status by Physician: Patient denies suicidal or homicidal ideation, hallucinations, illusions, or delusions. Patient engages with good eye contact, is able to focus adequately in a one to one setting, and has clear goal directed thoughts. Patient speaks with a natural conversational volume, rate, and tone. Anxiety was reported at 1 on a scale of 1 the least and 10 the most. Depression was reported at 1 on the same scale. Patient is oriented times 4, recent and remote memory intact. Judgement: Improved from admission Insight: Improved from admission  Loss Factors: NA  Historical Factors: NA  Continued Clinical Symptoms:  Severe Anxiety and/or Agitation Depression:   Insomnia  Discharge Diagnoses: Axis I: Bereavement and Overdose of benzodiazepine, Overdose on antidepressants.  Axis II: Deferred  Axis III:  Past Medical History   Diagnosis  Date   .  Anxiety    .  Depression     Axis IV: other psychosocial or environmental problems  Axis V: 61-70 mild symptoms   Cognitive Features That Contribute To Risk:  Thought constriction (tunnel vision)    Suicide Risk:  Minimal: No identifiable suicidal ideation.  Patients presenting with no risk factors but with morbid ruminations; may be classified as minimal risk based on the severity of the depressive symptoms  Labs:  Results for orders placed during the hospital encounter of 11/06/11 (from the past 72 hour(s))  TSH     Status: Normal   Collection Time   11/07/11  6:20 AM      Component Value Range Comment   TSH 3.272  0.350 - 4.500 uIU/mL   T4, FREE     Status: Normal   Collection Time   11/07/11  6:20 AM      Component Value Range Comment   Free T4 0.98  0.80 - 1.80 ng/dL   URINALYSIS, ROUTINE W REFLEX MICROSCOPIC     Status: Abnormal   Collection Time   11/08/11  5:45 AM      Component Value Range Comment   Color, Urine YELLOW  YELLOW    APPearance CLEAR  CLEAR    Specific Gravity, Urine 1.018  1.005 - 1.030    pH 7.0  5.0 - 8.0    Glucose, UA NEGATIVE  NEGATIVE mg/dL    Hgb urine dipstick NEGATIVE  NEGATIVE    Bilirubin Urine NEGATIVE  NEGATIVE    Ketones, ur NEGATIVE  NEGATIVE mg/dL    Protein, ur NEGATIVE  NEGATIVE mg/dL    Urobilinogen, UA 0.2  0.0 - 1.0 mg/dL    Nitrite NEGATIVE  NEGATIVE    Leukocytes, UA TRACE (*) NEGATIVE   URINE MICROSCOPIC-ADD ON     Status: Normal   Collection Time   11/08/11  5:45 AM      Component Value Range Comment   Squamous Epithelial / LPF RARE  RARE    WBC, UA 0-2  <3 WBC/hpf    Bacteria, UA RARE  RARE     RISK REDUCTION FACTORS: What pt has learned from hospital stay is that it is okay to ask for help, they don't have to do everything themselves, others have gone through similar situations and that they are okay.  Risk of self harm is elevated by this her first suicide attempt, her depression and multiple losses, but she has decided that she has everything, her life, her child, and herself to live for.  Risk of harm  to others is minimal in that she has not been involved in fights or had any legal charges filed on her.  Pt seen in treatment team where she divulged the above information. The treatment team concluded that she was ready for discharge and had met her goals for an inpatient setting.  PLAN: Discharge home Continue   Medication List     As of 11/09/2011  1:23 PM    STOP taking these medications         LORazepam 0.5 MG tablet   Commonly known as: ATIVAN      traZODone 50 MG tablet   Commonly known as: DESYREL      TAKE these medications      Indication    cetirizine 10 MG tablet   Commonly known as: ZYRTEC   Take 10 mg by mouth daily as needed. For allergies       chlorproMAZINE 10 MG tablet   Commonly known as: THORAZINE   Take 1 tablet (10 mg total) by mouth 3 (three) times daily as needed (anxiety).    Indication:  anxiety      citalopram 10 MG tablet   Commonly known as: CELEXA   Take 2 tablets (20 mg total) by mouth daily.    Indication: Depression      diphenhydrAMINE 50 MG capsule   Commonly known as: BENADRYL   Take by mouth 1 to 2 at bed for insomnia       ferrous sulfate 325 (65 FE) MG tablet   Take 1 tablet (325 mg total) by mouth 2 (two) times daily with a meal. For iron replacement.       ibuprofen 200 MG tablet   Commonly known as: ADVIL,MOTRIN   Take 600 mg by mouth every 6 (six) hours as needed. For pain       metroNIDAZOLE 0.75 % gel   Commonly known as: METROGEL   Apply 1 application topically daily. To face        Follow-up recommendations:  Activities: Resume typical activities Diet: Resume typical diet Tests: none Other: Follow up with outpatient provider and report any side effects to out patient prescriber.  Stacey Pittman 11/09/2011, 1:21 PM

## 2011-11-09 NOTE — Progress Notes (Signed)
Psychoeducational Group Note  Date:  11/09/2011 Time:  1030  Group Topic/Focus:  Relapse Prevention Planning:   The focus of this group is to define relapse and discuss the need for planning to combat relapse.  Participation Level:  Active  Participation Quality:  Appropriate and Attentive  Affect:  Appropriate  Cognitive:  Alert, Appropriate and Oriented  Insight:  Good  Engagement in Group:  Good  Additional Comments:  Pt. Attended group and participated in making a relapse prevention plan   Karna, Abed 11/09/2011, 11:30 AM

## 2011-11-12 NOTE — Progress Notes (Signed)
Patient Discharge Instructions:  After Visit Summary (AVS):   Faxed to:  11/12/2011 Psychiatric Admission Assessment Note:   Faxed to:  11/12/2011 Suicide Risk Assessment - Discharge Assessment:   Faxed to:  11/12/2011 Faxed/Sent to the Next Level Care provider:  11/12/2011  Faxed to [facility] - Orbie Pyo @ [phone]  Wandra Scot, 11/12/2011, 5:08 PM

## 2017-01-20 ENCOUNTER — Emergency Department (HOSPITAL_COMMUNITY): Payer: BLUE CROSS/BLUE SHIELD

## 2017-01-20 ENCOUNTER — Emergency Department (HOSPITAL_COMMUNITY)
Admission: EM | Admit: 2017-01-20 | Discharge: 2017-01-20 | Disposition: A | Discharge status: Home / Self Care | Payer: BLUE CROSS/BLUE SHIELD | Attending: Emergency Medicine | Admitting: Emergency Medicine

## 2017-01-20 ENCOUNTER — Encounter (HOSPITAL_COMMUNITY): Payer: Self-pay

## 2017-01-20 ENCOUNTER — Other Ambulatory Visit: Payer: Self-pay

## 2017-01-20 DIAGNOSIS — R002 Palpitations: Secondary | ICD-10-CM

## 2017-01-20 DIAGNOSIS — Z79899 Other long term (current) drug therapy: Secondary | ICD-10-CM | POA: Insufficient documentation

## 2017-01-20 DIAGNOSIS — R079 Chest pain, unspecified: Secondary | ICD-10-CM | POA: Diagnosis not present

## 2017-01-20 HISTORY — DX: Gestational diabetes mellitus in pregnancy, unspecified control: O24.419

## 2017-01-20 HISTORY — DX: Bipolar disorder, unspecified: F31.9

## 2017-01-20 HISTORY — DX: Anemia, unspecified: D64.9

## 2017-01-20 LAB — CBC
HCT: 34 % — ABNORMAL LOW (ref 36.0–46.0)
Hemoglobin: 11.3 g/dL — ABNORMAL LOW (ref 12.0–15.0)
MCH: 28.8 pg (ref 26.0–34.0)
MCHC: 33.2 g/dL (ref 30.0–36.0)
MCV: 86.7 fL (ref 78.0–100.0)
PLATELETS: 280 10*3/uL (ref 150–400)
RBC: 3.92 MIL/uL (ref 3.87–5.11)
RDW: 14.2 % (ref 11.5–15.5)
WBC: 6.9 10*3/uL (ref 4.0–10.5)

## 2017-01-20 LAB — HEPATIC FUNCTION PANEL
ALBUMIN: 3.9 g/dL (ref 3.5–5.0)
ALK PHOS: 49 U/L (ref 38–126)
ALT: 17 U/L (ref 14–54)
AST: 18 U/L (ref 15–41)
BILIRUBIN INDIRECT: 1.2 mg/dL — AB (ref 0.3–0.9)
Bilirubin, Direct: 0.1 mg/dL (ref 0.1–0.5)
TOTAL PROTEIN: 6.7 g/dL (ref 6.5–8.1)
Total Bilirubin: 1.3 mg/dL — ABNORMAL HIGH (ref 0.3–1.2)

## 2017-01-20 LAB — I-STAT TROPONIN, ED
TROPONIN I, POC: 0 ng/mL (ref 0.00–0.08)
Troponin i, poc: 0 ng/mL (ref 0.00–0.08)
Troponin i, poc: 0 ng/mL (ref 0.00–0.08)

## 2017-01-20 LAB — BASIC METABOLIC PANEL
Anion gap: 7 (ref 5–15)
BUN: 9 mg/dL (ref 6–20)
CHLORIDE: 104 mmol/L (ref 101–111)
CO2: 24 mmol/L (ref 22–32)
CREATININE: 1.01 mg/dL — AB (ref 0.44–1.00)
Calcium: 9.1 mg/dL (ref 8.9–10.3)
GFR calc non Af Amer: >60 mL/min (ref >60)
Glucose, Bld: 139 mg/dL — ABNORMAL HIGH (ref 65–99)
Potassium: 3.5 mmol/L (ref 3.5–5.1)
Sodium: 135 mmol/L (ref 135–145)

## 2017-01-20 LAB — LIPASE, BLOOD: Lipase: 34 U/L (ref 11–51)

## 2017-01-20 LAB — D-DIMER, QUANTITATIVE: D-Dimer, Quant: <0.27 ug/mL-FEU (ref 0.00–0.50)

## 2017-01-20 NOTE — ED Notes (Signed)
Patient transported to X-ray 

## 2017-01-20 NOTE — ED Provider Notes (Signed)
[facility]   CSN: [id number] Arrival date & time: 01/20/17  1720     History   Chief Complaint Chief Complaint  Patient presents with  . Chest Pain    HPI Stacey Pittman is a 36 y.o. female.  HPI  Presents with concern for chest pain, palpitations. Had one episode last night that improved and episode today. Pressure, fluttering sensation, getting worse, felt lightheaded. Felt significant palpitations, heart pounding. Had some associated shortness of breath. Was coming in waves, lasted 20min then eased off then came back a few times like that.  Had tingling and burning into left arm.  Aspirin and nitro with EMS did not help.  Now left side still feels odd and some pressure, fatigue, 3/10 right now, earlier was 8/10.  Felt like trying to stay calm, take deep breaths would keep her from having full sensation like she was going to pass out. This episode started at 245 today.  Still feel like need to consciously take deep breath, no sig dyspnea. Feels jittery.  No nausea, or vomiting, pressure upper abdomen.  No diarrhea, fevers, cough.  No leg pain or swelling.   Not sure if took seroquel this morning instead of lamictal.   No OCP, no long trips, recent surgeries, hx of DVT or PE  Grandpa had MI at 55 No immediate family with hx of MI  Past Medical History:  Diagnosis Date  . Anemia   . Anxiety   . Bipolar disorder (HCC)   . Depression   . Gestational diabetes     Patient Active Problem List   Diagnosis Date Noted  . Anemia 11/05/2011  . Overdose of benzodiazepine 11/04/2011  . Overdose of antidepressant 11/04/2011  . Suicidal intent 11/04/2011  . Depression 11/04/2011  . UTI (lower urinary tract infection) 11/04/2011  . Hypokalemia 11/04/2011    Past Surgical History:  Procedure Laterality Date  . CESAREAN SECTION    . WISDOM TOOTH EXTRACTION      OB History    No data available       Home Medications     Prior to Admission medications   Medication Sig Start Date End Date Taking? Authorizing Provider  cyanocobalamin (,VITAMIN B-12,) 1000 MCG/ML injection Inject 1,000 mcg into the muscle every 7 (seven) days.   Yes [provider]  lamoTRIgine (LAMICTAL) 200 MG tablet Take 200 mg by mouth every morning.   Yes [provider]  QUEtiapine (SEROQUEL XR) 300 MG 24 hr tablet Take 300 mg by mouth at bedtime.   Yes [provider]  citalopram (CELEXA) 10 MG tablet Take 2 tablets (20 mg total) by mouth daily. Patient not taking: Reported on 01/20/2017 11/09/11   Mike CrazeWalker, Edwin O, MD    Family History Family History  Problem Relation Age of Onset  . Cancer Mother     Social History Social History   Tobacco Use  . Smoking status: Never Smoker  Substance Use Topics  . Alcohol use: No    Comment: occ  . Drug use: No     Allergies   Albuterol; Morphine and related; and Penicillins   Review of Systems Review of Systems  Constitutional: Positive for fatigue. Negative for fever.  HENT: Negative for sore throat.   Eyes: Negative for visual disturbance.  Respiratory: Positive for shortness of breath. Negative for cough.   Cardiovascular: Positive for chest pain and palpitations. Negative for leg swelling.  Gastrointestinal: Negative for abdominal pain, nausea and  vomiting.  Genitourinary: Negative for difficulty urinating.  Musculoskeletal: Negative for back pain and neck pain.  Skin: Negative for rash.  Neurological: Positive for light-headedness. Negative for syncope and headaches.     Physical Exam Updated Vital Signs BP 121/86   Pulse 82   Temp 99 F (37.2 C) (Oral)   Resp 15   Ht 5\' 7"  (1.702 m)   Wt 104.3 kg (230 lb)   LMP 01/13/2017 (Approximate)   SpO2 98%   BMI 36.02 kg/m   Physical Exam  Constitutional: She is oriented to person, place, and time. She appears well-developed and well-nourished. No distress.  HENT:  Head: Normocephalic  and atraumatic.  Eyes: Conjunctivae and EOM are normal.  Neck: Normal range of motion.  Cardiovascular: Normal rate, regular rhythm, normal heart sounds and intact distal pulses. Exam reveals no gallop and no friction rub.  No murmur heard. Pulmonary/Chest: Effort normal and breath sounds normal. No respiratory distress. She has no wheezes. She has no rales.  Abdominal: Soft. She exhibits no distension. There is tenderness in the right upper quadrant and epigastric area. There is no guarding and negative Murphy's sign.  Musculoskeletal: She exhibits no edema or tenderness.  Neurological: She is alert and oriented to person, place, and time.  Skin: Skin is warm and dry. No rash noted. She is not diaphoretic. No erythema.  Nursing note and vitals reviewed.    ED Treatments / Results  Labs (all labs ordered are listed, but only abnormal results are displayed) Labs Reviewed  BASIC METABOLIC PANEL - Abnormal; Notable for the following components:      Result Value   Glucose, Bld 139 (*)    Creatinine, Ser 1.01 (*)    All other components within normal limits  CBC - Abnormal; Notable for the following components:   Hemoglobin 11.3 (*)    HCT 34.0 (*)    All other components within normal limits  HEPATIC FUNCTION PANEL - Abnormal; Notable for the following components:   Total Bilirubin 1.3 (*)    Indirect Bilirubin 1.2 (*)    All other components within normal limits  D-DIMER, QUANTITATIVE (NOT AT [facility])  LIPASE, BLOOD  I-STAT TROPONIN, ED  I-STAT TROPONIN, ED  I-STAT TROPONIN, ED    EKG  EKG Interpretation  Date/Time:  Sunday January 20 2017 17:36:47 EST Ventricular Rate:  92 PR Interval:    QRS Duration: 79 QT Interval:  341 QTC Calculation: 422 R Axis:   -19 Text Interpretation:  Sinus rhythm Borderline left axis deviation Low voltage, precordial leads Abnormal R-wave progression, early transition No significant change since last tracing Confirmed by Alvira MondaySchlossman, Gatsby Chismar  ([id number]) on 01/20/2017 9:56:55 PM Also confirmed by Alvira MondaySchlossman, Cael Worth ([id number]), editor Elita QuickWatlington, Beverly ([id number])  on 01/21/2017 8:15:28 AM       Radiology Dg Chest 2 View  Result Date: 01/20/2017 CLINICAL DATA:  36 year old female with chest pain for 2 hours. EXAM: CHEST  2 VIEW COMPARISON:  None. FINDINGS: The heart size and mediastinal contours are within normal limits. Both lungs are clear. The visualized skeletal structures are unremarkable. IMPRESSION: No active cardiopulmonary disease. Electronically Signed   By: Sande BrothersSerena  Chacko M.D.   On: 01/20/2017 18:35    Procedures Procedures (including critical care time)  Medications Ordered in ED Medications - No data to display   Initial Impression / Assessment and Plan / ED Course  I have reviewed the triage vital signs and the nursing notes.  Pertinent labs & imaging results that were available during  my care of the patient were reviewed by me and considered in my medical decision making (see chart for details).     36 year old female with history of anxiety, bipolar, presents with concern of chest pain. Differential diagnosis for chest pain includes pulmonary embolus, dissection, pneumothorax, pneumonia, ACS, myocarditis, pericarditis.  EKG was done and evaluate by me and showed no acute ST changes and no signs of pericarditis.  No QTc prolongation. Chest x-ray was done and evaluated by me and radiology and showed no sign pneumonia or pneumothorax. Patient is low risk [place] with a negative ddimerand have low suspicion for PE.  Patient is low risk HEART score and had delta troponins which were both negative. Given this evaluation, history and physical have low suspicion for pulmonary embolus, pneumonia, ACS, myocarditis, pericarditis, dissection.  Has some right upper quadrant tenderness, but no leukocytosis, no fever, no vomiting and doubt cholecystitis. Hepatic panel WNL with exception of mildly elevated bilirubin which is chronic per pt,  and lipase WNL.  Recommend follow up with PCP within next week. Patient discharged in stable condition with understanding of reasons to return.   Final Clinical Impressions(s) / ED Diagnoses   Final diagnoses:  Chest pain, unspecified type  Palpitations    ED Discharge Orders    None       Alvira Monday, MD 01/21/17 1441

## 2017-01-20 NOTE — ED Triage Notes (Signed)
Pt brought in by [id number] for chest pain that started x2 hours ago. Pt describes it as left sided pressure and a burning sensation that goes down her the left side of her neck and her left arm. Pt has hx of bipolar disorder. Pt reports palpitations. Pt had a similar episode yesterday. Both episodes occurred after eating. EMS gave 324mg  aspirin and 1 nitro. Pt reports no changes after nitro. Pt endorses nausea but no vomiting.

## 2018-06-03 IMAGING — DX DG CHEST 2V
2 series · 2 of 2 positions shown · non-contrast
Comparison: None.

CLINICAL DATA: 28-year-old female with chest pain for 2 hours.

EXAM:
CHEST  2 VIEW

[chest pa]
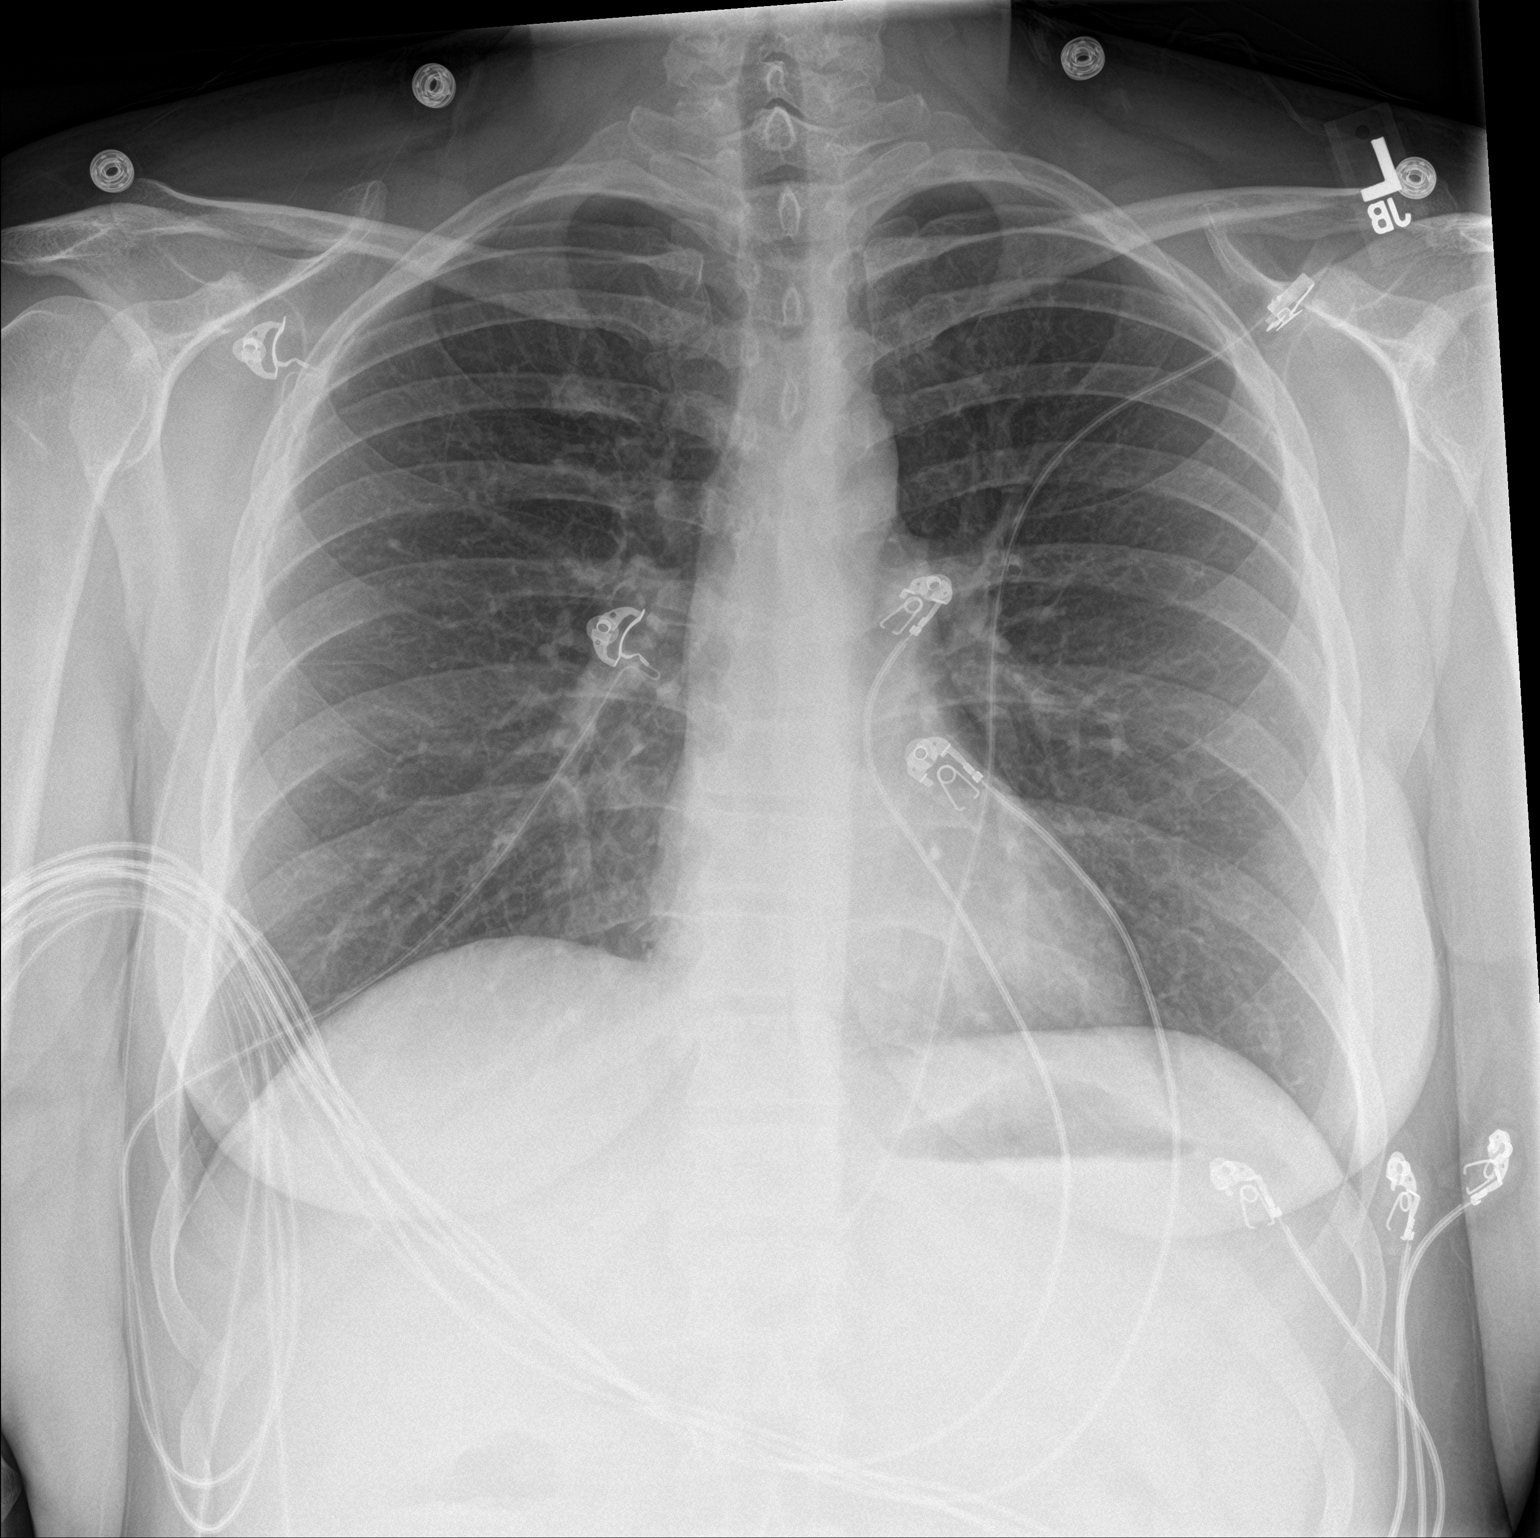

[chest lat]
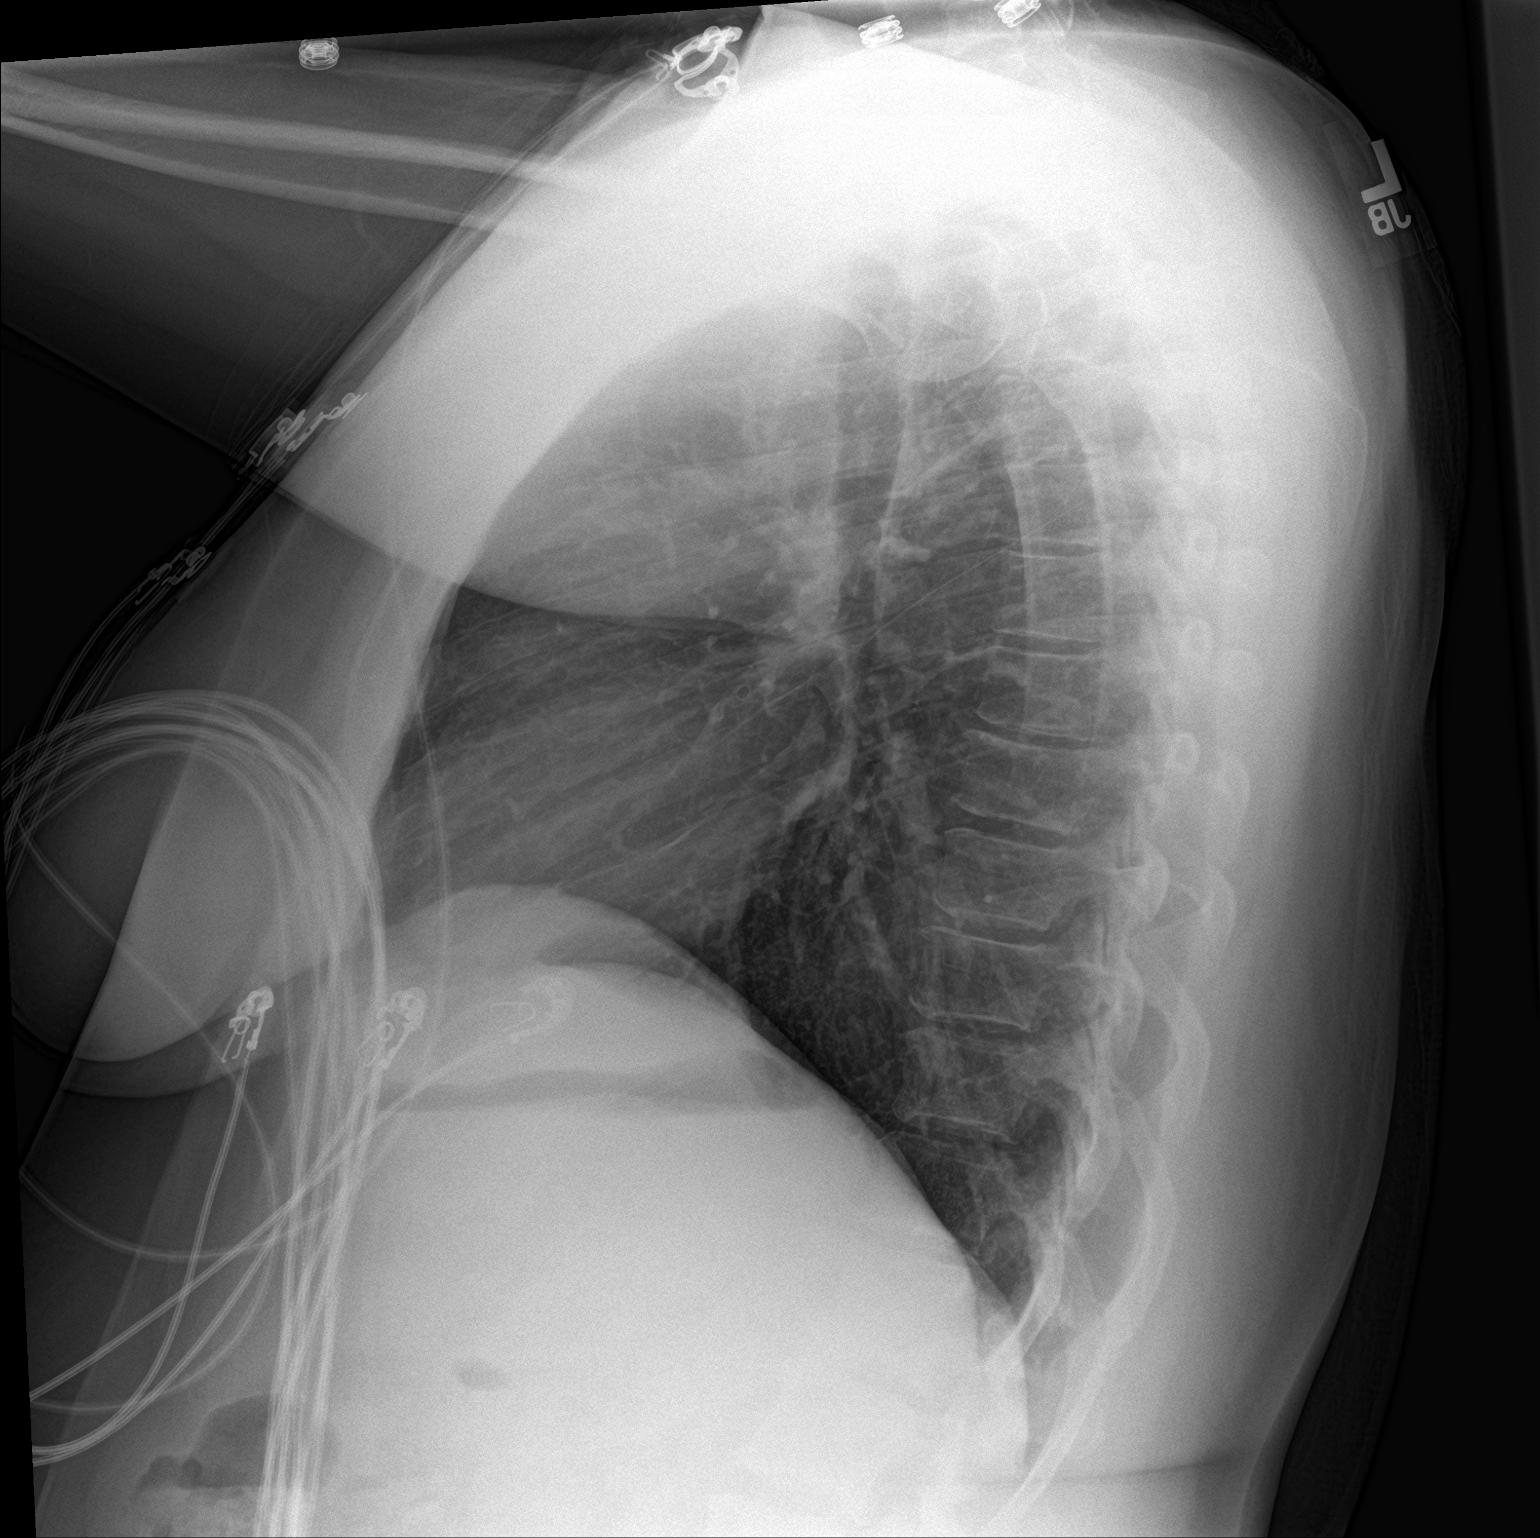

[2 of 2 positions shown; findings below may reference images not displayed]

FINDINGS: The heart size and mediastinal contours are within normal limits.
Both lungs are clear. The visualized skeletal structures are
unremarkable.
IMPRESSION: No active cardiopulmonary disease.
